# Patient Record
Sex: Female | Born: 1985
Health system: Southern US, Community
[De-identification: ages and names within clinical notes are randomized; demographics above are authoritative.]

## PROBLEM LIST (undated history)

## (undated) DIAGNOSIS — K219 Gastro-esophageal reflux disease without esophagitis: Secondary | ICD-10-CM

## (undated) DIAGNOSIS — Z8759 Personal history of other complications of pregnancy, childbirth and the puerperium: Secondary | ICD-10-CM

## (undated) DIAGNOSIS — O139 Gestational [pregnancy-induced] hypertension without significant proteinuria, unspecified trimester: Secondary | ICD-10-CM

## (undated) HISTORY — PX: WISDOM TOOTH EXTRACTION: SHX21

## (undated) HISTORY — PX: NO PAST SURGERIES: SHX2092

## (undated) HISTORY — DX: Gastro-esophageal reflux disease without esophagitis: K21.9

## (undated) HISTORY — DX: Gestational (pregnancy-induced) hypertension without significant proteinuria, unspecified trimester: O13.9

## (undated) HISTORY — DX: Personal history of other complications of pregnancy, childbirth and the puerperium: Z87.59

---

## 2013-01-23 ENCOUNTER — Ambulatory Visit: Payer: Self-pay | Admitting: Family Medicine

## 2013-01-28 ENCOUNTER — Ambulatory Visit: Payer: Self-pay | Admitting: Family Medicine

## 2013-03-03 ENCOUNTER — Ambulatory Visit: Payer: Self-pay | Admitting: Family Medicine

## 2014-10-01 ENCOUNTER — Ambulatory Visit (INDEPENDENT_AMBULATORY_CARE_PROVIDER_SITE_OTHER): Payer: BLUE CROSS/BLUE SHIELD | Admitting: Physician Assistant

## 2014-10-01 ENCOUNTER — Encounter: Payer: Self-pay | Admitting: Physician Assistant

## 2014-10-01 VITALS — BP 118/78 | Temp 98.4°F | Resp 16 | Ht 63.0 in | Wt 165.0 lb

## 2014-10-01 DIAGNOSIS — Z1239 Encounter for other screening for malignant neoplasm of breast: Secondary | ICD-10-CM

## 2014-10-01 DIAGNOSIS — Z3041 Encounter for surveillance of contraceptive pills: Secondary | ICD-10-CM

## 2014-10-01 DIAGNOSIS — Z0000 Encounter for general adult medical examination without abnormal findings: Secondary | ICD-10-CM | POA: Diagnosis not present

## 2014-10-01 DIAGNOSIS — K219 Gastro-esophageal reflux disease without esophagitis: Secondary | ICD-10-CM | POA: Insufficient documentation

## 2014-10-01 DIAGNOSIS — K449 Diaphragmatic hernia without obstruction or gangrene: Secondary | ICD-10-CM | POA: Insufficient documentation

## 2014-10-01 MED ORDER — NORETHIN ACE-ETH ESTRAD-FE 1-20 MG-MCG(24) PO CHEW: 1.0000 | CHEWABLE_TABLET | Freq: Every day | ORAL | Status: DC

## 2014-10-01 NOTE — Patient Instructions (Signed)

## 2014-10-01 NOTE — Progress Notes (Signed)
Patient ID: Christie [name], female   DOB: June 10, 1985, 29 y.o.   MRN: [id number]       Patient: Christie [name] Female    DOB: 1985/04/17   29 y.o.   MRN: [id number] Visit Date: 10/01/2014  Today's Provider: Margaretann Loveless, PA-C   Chief Complaint  Patient presents with  . Annual Exam  . Contraception    needs refill on birth control    Subjective:    HPI Patient is here for her annual exam. She feels well with no compaints. She is also needing her birth control refilled. She is requesting generic because it would be cheaper. Patient reports that her last pap was in 2014 in [place] and was normal.  There is no cervical or ovarian cancer in her family history. She does perform monthly breast exams. There is no family history of breast cancer. There is no family history of colon cancer.    No Known Allergies Previous Medications   MINASTRIN 24 FE 1-20 MG-MCG(24) CHEW    Chew 1 tablet by mouth daily.    Review of Systems  Constitutional: Negative.   HENT: Negative.   Eyes: Negative.   Respiratory: Negative.   Cardiovascular: Negative.   Gastrointestinal: Negative.   Endocrine: Negative.   Genitourinary: Negative.   Musculoskeletal: Negative.   Skin: Negative.   Allergic/Immunologic: Negative.   Neurological: Negative.   Hematological: Negative.   Psychiatric/Behavioral: Negative.     History  Substance Use Topics  . Smoking status: Never Smoker   . Smokeless tobacco: Not on file  . Alcohol Use: No   Objective:   BP 118/78 mmHg  Temp(Src) 98.4 F (36.9 C)  Resp 16  Ht 5\' 3"  (1.6 m)  Wt 165 lb (74.844 kg)  BMI 29.24 kg/m2  Physical Exam  Constitutional: She is oriented to person, place, and time. She appears well-developed and well-nourished. No distress.  HENT:  Head: Normocephalic and atraumatic.  Right Ear: External ear normal.  Left Ear: External ear normal.  Nose: Nose normal.  Mouth/Throat: Oropharynx is clear and moist. No oropharyngeal exudate.  Eyes:  Conjunctivae and EOM are normal. Pupils are equal, round, and reactive to light. Right eye exhibits no discharge. Left eye exhibits no discharge. No scleral icterus.  Neck: Normal range of motion. Neck supple. No JVD present. No tracheal deviation present. No thyromegaly present.  Cardiovascular: Normal rate, regular rhythm, normal heart sounds and intact distal pulses.  Exam reveals no gallop and no friction rub.   No murmur heard. Pulmonary/Chest: Effort normal and breath sounds normal. No respiratory distress. She has no wheezes. She has no rales. She exhibits no tenderness. Right breast exhibits no inverted nipple, no mass, no nipple discharge, no skin change and no tenderness. Left breast exhibits no inverted nipple, no mass, no nipple discharge, no skin change and no tenderness. Breasts are symmetrical.  Abdominal: Soft. Bowel sounds are normal. She exhibits no distension and no mass. There is no tenderness. There is no rebound and no guarding.  Genitourinary:  Deferred until next year.  Musculoskeletal: Normal range of motion. She exhibits no edema or tenderness.  Lymphadenopathy:    She has no cervical adenopathy.  Neurological: She is alert and oriented to person, place, and time.  Skin: Skin is warm and dry. No rash noted. She is not diaphoretic.  Psychiatric: She has a normal mood and affect. Her behavior is normal. Judgment and thought content normal.  Vitals reviewed.       Assessment &  Plan:     1. Annual physical exam We'll check labs, follow-up pending labs. - TSH - CBC w/Diff - Comprehensive Metabolic Panel (CMET) - Lipid panel  2. Breast cancer screening Breast exam is normal. She does perform monthly breast exams.  There is no family history of breast cancer.  3. Encounter for surveillance of contraceptive pills She has been on birth control for a while and has tolerated this very well. She needs a refill. Minastrin 24-Fe refilled as below. - Norethin Ace-Eth  Estrad-FE 1-20 MG-MCG(24) CHEW; Chew 1 tablet by mouth daily.  Dispense: 28 tablet; Refill: 11       Margaretann Loveless, PA-C  [facility]

## 2014-10-28 ENCOUNTER — Other Ambulatory Visit: Payer: Self-pay | Admitting: Physician Assistant

## 2014-10-28 DIAGNOSIS — Z3041 Encounter for surveillance of contraceptive pills: Secondary | ICD-10-CM

## 2014-10-28 MED ORDER — NORETHIN ACE-ETH ESTRAD-FE 1-20 MG-MCG(24) PO CHEW: 1.0000 | CHEWABLE_TABLET | Freq: Every day | ORAL | Status: DC

## 2014-10-28 NOTE — Telephone Encounter (Signed)
Patient advised. RX sent to pharmacy.  

## 2014-10-28 NOTE — Telephone Encounter (Signed)
Pt contacted office for refill request on the following medications:  Norethin Ace-Eth Estrad-FE 1-20 MG-MCG(24) CHEW.  Pt has changed pharmacy.  Pt is requesting this resent to [facility].  CB#[phone]/MW

## 2014-10-28 NOTE — Telephone Encounter (Signed)
Please notify patient new Rx sent to [facility].  Thanks!

## 2015-03-25 ENCOUNTER — Ambulatory Visit (INDEPENDENT_AMBULATORY_CARE_PROVIDER_SITE_OTHER): Payer: BLUE CROSS/BLUE SHIELD | Admitting: Physician Assistant

## 2015-03-25 ENCOUNTER — Encounter: Payer: Self-pay | Admitting: Physician Assistant

## 2015-03-25 VITALS — BP 120/80 | HR 87 | Temp 98.1°F | Resp 16 | Wt 158.2 lb

## 2015-03-25 DIAGNOSIS — Z3041 Encounter for surveillance of contraceptive pills: Secondary | ICD-10-CM | POA: Diagnosis not present

## 2015-03-25 MED ORDER — NORETHIN ACE-ETH ESTRAD-FE 1-20 MG-MCG PO TABS: 1.0000 | ORAL_TABLET | Freq: Every day | ORAL | Status: DC

## 2015-03-25 NOTE — Progress Notes (Signed)
       Patient: Christie [name] Female    DOB: 12/21/1985   30 y.o.   MRN: [id number] Visit Date: 03/25/2015  Today's Provider: Margaretann LovelessJennifer M Teng Decou, PA-C   Chief Complaint  Patient presents with  . Medication Refill   Subjective:    HPI Contraception Counseling: Patient presents for contraception counseling, patient is currently on the Junel and wants to change to a different one. Has been on this birth control for the past six month. The patient complains about dry eyes,headaches, cramping, today. The patient is sexually active. Pertinent past medical history: none.    No Known Allergies Previous Medications   No medications on file    Review of Systems  Constitutional: Negative.   Eyes: Negative.        Dry eyes  Respiratory: Negative.   Cardiovascular: Negative.   Gastrointestinal: Negative.   Endocrine: Negative.   Genitourinary: Negative.   Musculoskeletal: Negative.   Skin: Negative.   Allergic/Immunologic: Negative.   Neurological: Positive for headaches.  Hematological: Negative.   Psychiatric/Behavioral: Negative.     Social History  Substance Use Topics  . Smoking status: Never Smoker   . Smokeless tobacco: Not on file  . Alcohol Use: No   Objective:   BP 120/80 mmHg  Pulse 87  Temp(Src) 98.1 F (36.7 C) (Oral)  Resp 16  Wt 158 lb 3.2 oz (71.759 kg)  LMP 03/15/2015  Physical Exam  Constitutional: She appears well-developed and well-nourished. No distress.  HENT:  Head: Normocephalic and atraumatic.  Cardiovascular: Normal rate, regular rhythm and normal heart sounds.  Exam reveals no gallop and no friction rub.   No murmur heard. Pulmonary/Chest: Effort normal and breath sounds normal. No respiratory distress. She has no wheezes. She has no rales.  Skin: She is not diaphoretic.  Psychiatric: She has a normal mood and affect. Her behavior is normal. Judgment and thought content normal.  Vitals reviewed.       Assessment & Plan:     1.  Encounter for surveillance of contraceptive pills Having side effects with generic Junel so will change to Loestrin as below.  States she had moodiness with triphasic oral contraceptives so would like to stay on monophasic. She is to call the office if continues to have side effects.  Will also check labs from previous to make sure she does not have any other cause for these side effects.  - norethindrone-ethinyl estradiol (LOESTRIN FE 1/20) 1-20 MG-MCG tablet; Take 1 tablet by mouth daily.  Dispense: 1 Package; Refill: 3       Margaretann LovelessJennifer M Shlomo Seres, PA-C  [facility]

## 2015-03-25 NOTE — Patient Instructions (Signed)
Ethinyl Estradiol; Norethindrone Acetate tablets (contraception) What is this medicine? ETHINYL ESTRADIOL; NORETHINDRONE ACETATE (ETH in il es tra DYE ole; nor eth IN drone AS e tate) is an oral contraceptive. The products combine two types of female hormones, an estrogen and a progestin. They are used to prevent ovulation and pregnancy. This medicine may be used for other purposes; ask your health care provider or pharmacist if you have questions. What should I tell my health care provider before I take this medicine? They need to know if you have or ever had any of these conditions: -abnormal vaginal bleeding -blood vessel disease or blood clots -breast, cervical, endometrial, ovarian, liver, or uterine cancer -diabetes -gallbladder disease -heart disease or recent heart attack -high blood pressure -high cholesterol -kidney disease -liver disease -migraine headaches -stroke -systemic lupus erythematosus (SLE) -tobacco smoker -an unusual or allergic reaction to estrogens, progestins, other medicines, foods, dyes, or preservatives -pregnant or trying to get pregnant -breast-feeding How should I use this medicine? Take this medicine by mouth. To reduce nausea, this medicine may be taken with food. Follow the directions on the prescription label. Take this medicine at the same time each day and in the order directed on the package. Do not take your medicine more often than directed. Contact your pediatrician regarding the use of this medicine in children. Special care may be needed. This medicine has been used in female children who have started having menstrual periods. A patient package insert for the product will be given with each prescription and refill. Read this sheet carefully each time. The sheet may change frequently. Overdosage: If you think you have taken too much of this medicine contact a poison control center or emergency room at once. NOTE: This medicine is only for you. Do  not share this medicine with others. What if I miss a dose? If you miss a dose, refer to the patient information sheet you received with your medicine for direction. If you miss more than one pill, this medicine may not be as effective and you may need to use another form of birth control. What may interact with this medicine? -acetaminophen -antibiotics or medicines for infections, especially rifampin, rifabutin, rifapentine, and griseofulvin, and possibly penicillins or tetracyclines -aprepitant -ascorbic acid (vitamin C) -atorvastatin -barbiturate medicines, such as phenobarbital -bosentan -carbamazepine -caffeine -clofibrate -cyclosporine -dantrolene -doxercalciferol -felbamate -grapefruit juice -hydrocortisone -medicines for anxiety or sleeping problems, such as diazepam or temazepam -medicines for diabetes, including pioglitazone -mineral oil -modafinil -mycophenolate -nefazodone -oxcarbazepine -phenytoin -prednisolone -ritonavir or other medicines for HIV infection or AIDS -rosuvastatin -selegiline -soy isoflavones supplements -St. John's wort -tamoxifen or raloxifene -theophylline -thyroid hormones -topiramate -warfarin This list may not describe all possible interactions. Give your health care provider a list of all the medicines, herbs, non-prescription drugs, or dietary supplements you use. Also tell them if you smoke, drink alcohol, or use illegal drugs. Some items may interact with your medicine. What should I watch for while using this medicine? Visit your doctor or health care professional for regular checks on your progress. You will need a regular breast and pelvic exam and Pap smear while on this medicine. Use an additional method of contraception during the first cycle that you take these tablets. If you have any reason to think you are pregnant, stop taking this medicine right away and contact your doctor or health care professional. If you are taking  this medicine for hormone related problems, it may take several cycles of use to see improvement in your   condition. Smoking increases the risk of getting a blood clot or having a stroke while you are taking birth control pills, especially if you are more than 28 years old. You are strongly advised not to smoke. This medicine can make your body retain fluid, making your fingers, hands, or ankles swell. Your blood pressure can go up. Contact your doctor or health care professional if you feel you are retaining fluid. This medicine can make you more sensitive to the sun. Keep out of the sun. If you cannot avoid being in the sun, wear protective clothing and use sunscreen. Do not use sun lamps or tanning beds/booths. If you wear contact lenses and notice visual changes, or if the lenses begin to feel uncomfortable, consult your eye care specialist. In some women, tenderness, swelling, or minor bleeding of the gums may occur. Notify your dentist if this happens. Brushing and flossing your teeth regularly may help limit this. See your dentist regularly and inform your dentist of the medicines you are taking. If you are going to have elective surgery, you may need to stop taking this medicine before the surgery. Consult your health care professional for advice. This medicine does not protect you against HIV infection (AIDS) or any other sexually transmitted diseases. What side effects may I notice from receiving this medicine? Side effects that you should report to your doctor or health care professional as soon as possible: -breast tissue changes or discharge -changes in vaginal bleeding during your period or between your periods -chest pain -coughing up blood -dizziness or fainting spells -headaches or migraines -leg, arm or groin pain -severe or sudden headaches -stomach pain (severe) -sudden shortness of breath -sudden loss of coordination, especially on one side of the body -speech  problems -symptoms of vaginal infection like itching, irritation or unusual discharge -tenderness in the upper abdomen -vomiting -weakness or numbness in the arms or legs, especially on one side of the body -yellowing of the eyes or skin Side effects that usually do not require medical attention (report to your doctor or health care professional if they continue or are bothersome): -breakthrough bleeding and spotting that continues beyond the 3 initial cycles of pills -breast tenderness -mood changes, anxiety, depression, frustration, anger, or emotional outbursts -increased sensitivity to sun or ultraviolet light -nausea -skin rash, acne, or brown spots on the skin -weight gain (slight) This list may not describe all possible side effects. Call your doctor for medical advice about side effects. You may report side effects to FDA at 1-800-FDA-1088. Where should I keep my medicine? Keep out of the reach of children. Store at room temperature between 15 and 30 degrees C (59 and 86 degrees F). Throw away any unused medicine after the expiration date. NOTE: This sheet is a summary. It may not cover all possible information. If you have questions about this medicine, talk to your doctor, pharmacist, or health care provider.    2016, Elsevier/Gold Standard. (2012-07-04 15:35:20)  

## 2015-04-18 ENCOUNTER — Telehealth: Payer: Self-pay | Admitting: Physician Assistant

## 2015-04-18 DIAGNOSIS — Z3009 Encounter for other general counseling and advice on contraception: Secondary | ICD-10-CM

## 2015-04-18 MED ORDER — LEVONORGESTREL-ETHINYL ESTRAD 0.1-20 MG-MCG PO TABS: 1.0000 | ORAL_TABLET | Freq: Every day | ORAL | Status: DC

## 2015-04-18 NOTE — Telephone Encounter (Signed)
Advised patient as below.  

## 2015-04-18 NOTE — Telephone Encounter (Signed)
Please advise.  Thanks,  -Coretta Leisey 

## 2015-04-18 NOTE — Telephone Encounter (Signed)
Not sure what happened. Sent in [facility] to [facility].

## 2015-04-18 NOTE — Telephone Encounter (Signed)
Pt stated that when she was in for an OV on 03/25/15 her birth control was changed but when she went to the pharmacy to get the new RX she stated that it was the same as what she had. norethindrone-ethinyl estradiol (LOESTRIN FE 1/20) 1-20 MG-MCG tablet was sent to [facility] on 03/25/15. Please advise. Thanks TNP

## 2016-03-12 NOTE — L&D Delivery Note (Signed)
Delivery Note At 7:13 PM a viable female was delivered via Vaginal, Spontaneous (Presentation:OA ;  ).  APGAR: 8, 9; weight  .   Placenta statusspont>>intact: , .  Cord:  with the following complications: .  Cord pH: not sent  Anesthesia:  epid Episiotomy: None Lacerations: Periurethral;2nd degree;Perineal Suture Repair: 3.0 vicryl rapide Est. Blood Loss (mL): 300  Mom to postpartum.  Baby to Couplet care / Skin to Skin.  [name],Anzley Dibbern M 01/18/2017, 7:47 PM

## 2016-05-28 ENCOUNTER — Telehealth: Payer: Self-pay

## 2016-05-28 NOTE — Telephone Encounter (Signed)
Acyclovir/valacyclovir is not thought to increase the risk for birth defects. The manufacturer, in combination with the Centers for Disease Control (CDC), looked at the effects of acyclovir on the developing baby. No increase in birth defects was seen in over 500 births. Also, a separate study found no increase in birth defects in over 1,500 infants exposed to acyclovir and over 200 infants exposed to valacyclovir during the first trimester.  Also congratulations on the pregnancy!

## 2016-05-28 NOTE — Telephone Encounter (Signed)
Patient advised as below. Patient verbalizes understanding and is in agreement with treatment plan.  

## 2016-05-28 NOTE — Telephone Encounter (Signed)
Patient calling that she has a sore on her lip and [facility]. She states she is [redacted] weeks pregnant. She wants to know if it is safe for to the generic name of Valtrex while pregnant. (If she has a flare)?   Please advise.  Thanks,  -Rilla Buckman

## 2016-07-04 DIAGNOSIS — R768 Other specified abnormal immunological findings in serum: Secondary | ICD-10-CM | POA: Insufficient documentation

## 2016-07-05 LAB — OB RESULTS CONSOLE GC/CHLAMYDIA
CHLAMYDIA, DNA PROBE: NEGATIVE
GC PROBE AMP, GENITAL: NEGATIVE

## 2016-07-05 LAB — OB RESULTS CONSOLE RPR: RPR: NONREACTIVE

## 2016-07-05 LAB — OB RESULTS CONSOLE ABO/RH: RH Type: POSITIVE

## 2016-07-05 LAB — OB RESULTS CONSOLE HEPATITIS B SURFACE ANTIGEN: Hepatitis B Surface Ag: NEGATIVE

## 2016-07-05 LAB — OB RESULTS CONSOLE ANTIBODY SCREEN: ANTIBODY SCREEN: NEGATIVE

## 2016-07-05 LAB — OB RESULTS CONSOLE RUBELLA ANTIBODY, IGM: Rubella: IMMUNE

## 2016-07-05 LAB — OB RESULTS CONSOLE HIV ANTIBODY (ROUTINE TESTING): HIV: NONREACTIVE

## 2016-12-26 LAB — OB RESULTS CONSOLE GBS: GBS: NEGATIVE

## 2017-01-16 ENCOUNTER — Encounter: Payer: Self-pay | Admitting: Physician Assistant

## 2017-01-17 ENCOUNTER — Encounter (HOSPITAL_COMMUNITY): Payer: Self-pay | Admitting: *Deleted

## 2017-01-17 ENCOUNTER — Inpatient Hospital Stay (HOSPITAL_COMMUNITY)
Admission: AD | Admit: 2017-01-17 | Discharge: 2017-01-20 | DRG: 807 | Disposition: A | Discharge status: Home / Self Care | Payer: BLUE CROSS/BLUE SHIELD | Source: Ambulatory Visit | Attending: Obstetrics and Gynecology | Admitting: Obstetrics and Gynecology

## 2017-01-17 ENCOUNTER — Telehealth (HOSPITAL_COMMUNITY): Payer: Self-pay | Admitting: *Deleted

## 2017-01-17 ENCOUNTER — Encounter (HOSPITAL_COMMUNITY): Payer: Self-pay | Admitting: Anesthesiology

## 2017-01-17 DIAGNOSIS — O9962 Diseases of the digestive system complicating childbirth: Secondary | ICD-10-CM | POA: Diagnosis present

## 2017-01-17 DIAGNOSIS — O134 Gestational [pregnancy-induced] hypertension without significant proteinuria, complicating childbirth: Secondary | ICD-10-CM | POA: Diagnosis present

## 2017-01-17 DIAGNOSIS — Z3490 Encounter for supervision of normal pregnancy, unspecified, unspecified trimester: Secondary | ICD-10-CM

## 2017-01-17 DIAGNOSIS — K219 Gastro-esophageal reflux disease without esophagitis: Secondary | ICD-10-CM | POA: Diagnosis present

## 2017-01-17 DIAGNOSIS — R030 Elevated blood-pressure reading, without diagnosis of hypertension: Secondary | ICD-10-CM | POA: Diagnosis present

## 2017-01-17 DIAGNOSIS — Z3A38 38 weeks gestation of pregnancy: Secondary | ICD-10-CM

## 2017-01-17 DIAGNOSIS — O1413 Severe pre-eclampsia, third trimester: Secondary | ICD-10-CM

## 2017-01-17 HISTORY — DX: Encounter for supervision of normal pregnancy, unspecified, unspecified trimester: Z34.90

## 2017-01-17 LAB — CBC
HEMATOCRIT: 39.8 % (ref 36.0–46.0)
Hemoglobin: 13.5 g/dL (ref 12.0–15.0)
MCH: 30 pg (ref 26.0–34.0)
MCHC: 33.9 g/dL (ref 30.0–36.0)
MCV: 88.4 fL (ref 78.0–100.0)
Platelets: 132 10*3/uL — ABNORMAL LOW (ref 150–400)
RBC: 4.5 MIL/uL (ref 3.87–5.11)
RDW: 14.2 % (ref 11.5–15.5)
WBC: 11.2 10*3/uL — ABNORMAL HIGH (ref 4.0–10.5)

## 2017-01-17 LAB — URINALYSIS, ROUTINE W REFLEX MICROSCOPIC
BILIRUBIN URINE: NEGATIVE
Glucose, UA: NEGATIVE mg/dL
KETONES UR: NEGATIVE mg/dL
Leukocytes, UA: NEGATIVE
Nitrite: NEGATIVE
PROTEIN: NEGATIVE mg/dL
Specific Gravity, Urine: <1.005 — ABNORMAL LOW (ref 1.005–1.030)
pH: 5.5 (ref 5.0–8.0)

## 2017-01-17 LAB — COMPREHENSIVE METABOLIC PANEL
ALK PHOS: 164 U/L — AB (ref 38–126)
ALT: 17 U/L (ref 14–54)
ANION GAP: 11 (ref 5–15)
AST: 25 U/L (ref 15–41)
Albumin: 3.3 g/dL — ABNORMAL LOW (ref 3.5–5.0)
BILIRUBIN TOTAL: 0.1 mg/dL — AB (ref 0.3–1.2)
BUN: 11 mg/dL (ref 6–20)
CALCIUM: 8.9 mg/dL (ref 8.9–10.3)
CO2: 20 mmol/L — ABNORMAL LOW (ref 22–32)
Chloride: 105 mmol/L (ref 101–111)
Creatinine, Ser: 0.67 mg/dL (ref 0.44–1.00)
GFR calc Af Amer: >60 mL/min (ref >60)
Glucose, Bld: 65 mg/dL (ref 65–99)
POTASSIUM: 3.9 mmol/L (ref 3.5–5.1)
Sodium: 136 mmol/L (ref 135–145)
TOTAL PROTEIN: 7 g/dL (ref 6.5–8.1)

## 2017-01-17 LAB — PROTEIN / CREATININE RATIO, URINE
Creatinine, Urine: 14 mg/dL
Protein Creatinine Ratio: 0.57 mg/mg{Cre} — ABNORMAL HIGH (ref 0.00–0.15)
Total Protein, Urine: 8 mg/dL

## 2017-01-17 LAB — TYPE AND SCREEN
ABO/RH(D): O POS
Antibody Screen: NEGATIVE

## 2017-01-17 LAB — ABO/RH: ABO/RH(D): O POS

## 2017-01-17 LAB — URINALYSIS, MICROSCOPIC (REFLEX)

## 2017-01-17 MED ORDER — OXYCODONE-ACETAMINOPHEN 5-325 MG PO TABS: 1.0000 | ORAL_TABLET | ORAL | Status: DC | PRN

## 2017-01-17 MED ORDER — OXYTOCIN 40 UNITS IN LACTATED RINGERS INFUSION - SIMPLE MED: 2.5000 [IU]/h | INTRAVENOUS | Status: DC

## 2017-01-17 MED ORDER — LABETALOL HCL 5 MG/ML IV SOLN
20.0000 mg | INTRAVENOUS | Status: AC | PRN
  Administered 2017-01-17: 20 mg via INTRAVENOUS
  Administered 2017-01-17: 40 mg via INTRAVENOUS
  Administered 2017-01-18: 60 mg via INTRAVENOUS
  Filled 2017-01-17: qty 8
  Filled 2017-01-17: qty 4
  Filled 2017-01-17: qty 12

## 2017-01-17 MED ORDER — ZOLPIDEM TARTRATE 5 MG PO TABS
5.0000 mg | ORAL_TABLET | Freq: Every evening | ORAL | Status: DC | PRN
  Administered 2017-01-18: 5 mg via ORAL
  Filled 2017-01-17: qty 1

## 2017-01-17 MED ORDER — LACTATED RINGERS IV SOLN
500.0000 mL | INTRAVENOUS | Status: DC | PRN
  Administered 2017-01-18: 700 mL via INTRAVENOUS

## 2017-01-17 MED ORDER — HYDRALAZINE HCL 20 MG/ML IJ SOLN
10.0000 mg | Freq: Once | INTRAMUSCULAR | Status: AC | PRN
  Administered 2017-01-18: 10 mg via INTRAVENOUS
  Filled 2017-01-17: qty 1

## 2017-01-17 MED ORDER — MAGNESIUM SULFATE 40 G IN LACTATED RINGERS - SIMPLE
2.0000 g/h | INTRAVENOUS | Status: DC
  Administered 2017-01-17 – 2017-01-18 (×2): 2 g/h via INTRAVENOUS
  Filled 2017-01-17 (×2): qty 40

## 2017-01-17 MED ORDER — LACTATED RINGERS IV SOLN
INTRAVENOUS | Status: DC
  Administered 2017-01-17: 125 mL/h via INTRAVENOUS
  Administered 2017-01-18: 05:00:00 via INTRAVENOUS

## 2017-01-17 MED ORDER — ACETAMINOPHEN 325 MG PO TABS
650.0000 mg | ORAL_TABLET | ORAL | Status: DC | PRN
  Administered 2017-01-17 – 2017-01-18 (×3): 650 mg via ORAL
  Filled 2017-01-17 (×4): qty 2

## 2017-01-17 MED ORDER — MISOPROSTOL 25 MCG QUARTER TABLET
25.0000 ug | ORAL_TABLET | ORAL | Status: AC
  Administered 2017-01-17 – 2017-01-18 (×2): 25 ug via VAGINAL
  Filled 2017-01-17 (×2): qty 1

## 2017-01-17 MED ORDER — SOD CITRATE-CITRIC ACID 500-334 MG/5ML PO SOLN: 30.0000 mL | ORAL | Status: DC | PRN

## 2017-01-17 MED ORDER — OXYCODONE-ACETAMINOPHEN 5-325 MG PO TABS: 2.0000 | ORAL_TABLET | ORAL | Status: DC | PRN

## 2017-01-17 MED ORDER — MAGNESIUM SULFATE BOLUS VIA INFUSION
4.0000 g | Freq: Once | INTRAVENOUS | Status: AC
  Administered 2017-01-17: 4 g via INTRAVENOUS
  Filled 2017-01-17: qty 500

## 2017-01-17 MED ORDER — LIDOCAINE HCL (PF) 1 % IJ SOLN
30.0000 mL | INTRAMUSCULAR | Status: DC | PRN
  Filled 2017-01-17: qty 30

## 2017-01-17 MED ORDER — ONDANSETRON HCL 4 MG/2ML IJ SOLN
4.0000 mg | Freq: Four times a day (QID) | INTRAMUSCULAR | Status: DC | PRN
  Administered 2017-01-18: 4 mg via INTRAVENOUS
  Filled 2017-01-17: qty 2

## 2017-01-17 MED ORDER — OXYTOCIN BOLUS FROM INFUSION
500.0000 mL | Freq: Once | INTRAVENOUS | Status: AC
  Administered 2017-01-18: 500 mL via INTRAVENOUS

## 2017-01-17 MED ORDER — TERBUTALINE SULFATE 1 MG/ML IJ SOLN: 0.2500 mg | Freq: Once | INTRAMUSCULAR | Status: DC | PRN

## 2017-01-17 MED ORDER — FLEET ENEMA 7-19 GM/118ML RE ENEM: 1.0000 | ENEMA | RECTAL | Status: DC | PRN

## 2017-01-17 NOTE — H&P (Signed)
Christie [name] is a 31 y.o. G 1 P 0 at 3838 w 4 days presented from triage with elevated BPs requiring IV labetalol. She is now admitted and status post first dose of Cytotec and now on Magnesium infusion OB History    Gravida Para Term Preterm AB Living   1         0   SAB TAB Ectopic Multiple Live Births                 Past Medical History:  Diagnosis Date  . GERD (gastroesophageal reflux disease)    Past Surgical History:  Procedure Laterality Date  . NO PAST SURGERIES    . WISDOM TOOTH EXTRACTION     Family History: family history includes Hypertension in her mother; Melanoma in her mother; Thyroid disease in her mother. Social History:  reports that  has never smoked. she has never used smokeless tobacco. She reports that she does not drink alcohol or use drugs.     Maternal Diabetes: No Genetic Screening: Normal Maternal Ultrasounds/Referrals: Normal Fetal Ultrasounds or other Referrals:  None Maternal Substance Abuse:  No Significant Maternal Medications:  None Significant Maternal Lab Results:  None Other Comments:  None  Review of Systems  All other systems reviewed and are negative.  History Dilation: Closed Exam by:: L.Stubbs, RN Blood pressure (!) 172/109, pulse 86, temperature 98.5 F (36.9 C), temperature source Oral, resp. rate 18, height 5\' 4"  (1.626 m), weight 88.5 kg (195 lb), last menstrual period 04/22/2016. Maternal Exam:  Abdomen: Fetal presentation: vertex     Fetal Exam Fetal State Assessment: Category I - tracings are normal.     Physical Exam  Nursing note and vitals reviewed. Constitutional: She appears well-developed.  HENT:  Head: Normocephalic.  Eyes: Pupils are equal, round, and reactive to light.  Neck: Normal range of motion.  Cardiovascular: Normal rate and regular rhythm.  Respiratory: Effort normal.  GI: Soft.    Prenatal labs: ABO, Rh: --/--/O POS (11/08 1930) Antibody: NEG (11/08 1930) Rubella: Immune (04/26  0000) RPR: Nonreactive (04/26 0000)  HBsAg: Negative (04/26 0000)  HIV: Non-reactive (04/26 0000)  GBS:    Results for orders placed or performed during the hospital encounter of 01/17/17 (from the past 24 hour(s))  Protein / creatinine ratio, urine     Status: Abnormal   Collection Time: 01/17/17  5:52 PM  Result Value Ref Range   Creatinine, Urine 14.00 mg/dL   Total Protein, Urine 8 mg/dL   Protein Creatinine Ratio 0.57 (H) 0.00 - 0.15 mg/mg[Cre]  Urinalysis, Routine w reflex microscopic     Status: Abnormal   Collection Time: 01/17/17  5:52 PM  Result Value Ref Range   Color, Urine YELLOW YELLOW   APPearance CLEAR CLEAR   Specific Gravity, Urine <1.005 (L) 1.005 - 1.030   pH 5.5 5.0 - 8.0   Glucose, UA NEGATIVE NEGATIVE mg/dL   Hgb urine dipstick TRACE (A) NEGATIVE   Bilirubin Urine NEGATIVE NEGATIVE   Ketones, ur NEGATIVE NEGATIVE mg/dL   Protein, ur NEGATIVE NEGATIVE mg/dL   Nitrite NEGATIVE NEGATIVE   Leukocytes, UA NEGATIVE NEGATIVE  Urinalysis, Microscopic (reflex)     Status: Abnormal   Collection Time: 01/17/17  5:52 PM  Result Value Ref Range   RBC / HPF 0-5 0 - 5 RBC/hpf   WBC, UA 0-5 0 - 5 WBC/hpf   Bacteria, UA RARE (A) NONE SEEN   Squamous Epithelial / LPF 0-5 (A) NONE SEEN  CBC  Status: Abnormal   Collection Time: 01/17/17  7:30 PM  Result Value Ref Range   WBC 11.2 (H) 4.0 - 10.5 K/uL   RBC 4.50 3.87 - 5.11 MIL/uL   Hemoglobin 13.5 12.0 - 15.0 g/dL   HCT 41.339.8 24.436.0 - 01.046.0 %   MCV 88.4 78.0 - 100.0 fL   MCH 30.0 26.0 - 34.0 pg   MCHC 33.9 30.0 - 36.0 g/dL   RDW 27.214.2 53.611.5 - 64.415.5 %   Platelets 132 (L) 150 - 400 K/uL  Comprehensive metabolic panel     Status: Abnormal   Collection Time: 01/17/17  7:30 PM  Result Value Ref Range   Sodium 136 135 - 145 mmol/L   Potassium 3.9 3.5 - 5.1 mmol/L   Chloride 105 101 - 111 mmol/L   CO2 20 (L) 22 - 32 mmol/L   Glucose, Bld 65 65 - 99 mg/dL   BUN 11 6 - 20 mg/dL   Creatinine, Ser 0.340.67 0.44 - 1.00 mg/dL    Calcium 8.9 8.9 - 74.210.3 mg/dL   Total Protein 7.0 6.5 - 8.1 g/dL   Albumin 3.3 (L) 3.5 - 5.0 g/dL   AST 25 15 - 41 U/L   ALT 17 14 - 54 U/L   Alkaline Phosphatase 164 (H) 38 - 126 U/L   Total Bilirubin 0.1 (L) 0.3 - 1.2 mg/dL   GFR calc non Af Amer >60 >60 mL/min   GFR calc Af Amer >60 >60 mL/min   Anion gap 11 5 - 15  Type and screen [facility]     Status: None   Collection Time: 01/17/17  7:30 PM  Result Value Ref Range   ABO/RH(D) O POS    Antibody Screen NEG    Sample Expiration 01/20/2017     Assessment/Plan: IUP at 38 w 4 days Gestational hypertension Continue Magnesium  Labetalol prn Continue cytotec for 3 total doses tonight  Plan of care reviewed with patient   Wess Baney L 01/17/2017, 9:10 PM

## 2017-01-17 NOTE — MAU Provider Note (Signed)
Chief Complaint:  Hypertension and Headache   Provider entered room at 1835   HPI: Christie [name] is a 31 y.o. G1P0 at 2638w4dwho presents to maternity admissions reporting elevated BP in office yesterday and development of headache today, unresponsive to Tylenol.  Developed swelling about 2 weeks ago, but hypertension is new since yesterday. She reports good fetal movement, denies LOF, vaginal bleeding, vaginal itching/burning, urinary symptoms, dizziness, n/v, diarrhea, constipation or fever/chills.  She denies visual changes.  Does have some RUQ abdominal pain.  Hypertension  This is a new problem. The current episode started yesterday. The problem has been gradually worsening since onset. Associated symptoms include headaches and peripheral edema. Pertinent negatives include no anxiety, blurred vision, chest pain, palpitations or shortness of breath. There are no associated agents to hypertension. There are no known risk factors for coronary artery disease. Past treatments include nothing. There are no compliance problems.   Headache   This is a new problem. The current episode started today. The problem occurs constantly. The problem has been unchanged. Pertinent negatives include no back pain, blurred vision, fever, photophobia, visual change, vomiting or weakness. Nothing aggravates the symptoms. She has tried acetaminophen for the symptoms. The treatment provided no relief. Her past medical history is significant for hypertension.      Past Medical History: Past Medical History:  Diagnosis Date  . GERD (gastroesophageal reflux disease)     Past obstetric history: OB History  Gravida Para Term Preterm AB Living  1         0  SAB TAB Ectopic Multiple Live Births               # Outcome Date GA Lbr Len/2nd Weight Sex Delivery Anes PTL Lv  1 Current               Past Surgical History: Past Surgical History:  Procedure Laterality Date  . NO PAST SURGERIES    . WISDOM TOOTH  EXTRACTION      Family History: Family History  Problem Relation Age of Onset  . Melanoma Mother   . Thyroid disease Mother   . Hypertension Mother     Social History: Social History   Tobacco Use  . Smoking status: Never Smoker  . Smokeless tobacco: Never Used  Substance Use Topics  . Alcohol use: No    Alcohol/week: 0.0 oz  . Drug use: No    Allergies: No Known Allergies  Meds:  Medications Prior to Admission  Medication Sig Dispense Refill Last Dose  . levonorgestrel-ethinyl estradiol (AVIANE,ALESSE,LESSINA) 0.1-20 MG-MCG tablet Take 1 tablet by mouth daily. 1 Package 11     I have reviewed patient's Past Medical Hx, Surgical Hx, Family Hx, Social Hx, medications and allergies.   ROS:  Review of Systems  Constitutional: Negative for fever.  Eyes: Negative for blurred vision and photophobia.  Respiratory: Negative for shortness of breath.   Cardiovascular: Negative for chest pain and palpitations.  Gastrointestinal: Negative for vomiting.  Musculoskeletal: Negative for back pain.  Neurological: Positive for headaches. Negative for weakness.   Other systems negative  Physical Exam   Patient Vitals for the past 24 hrs:  BP Temp Pulse Resp Height  01/17/17 1845 (!) 152/107 - 90 - -  01/17/17 1830 (!) 147/97 - 80 20 -  01/17/17 1819 (!) 152/98 - 78 - -  01/17/17 1800 (!) 169/114 97.8 F (36.6 C) 89 18 5\' 4"  (1.626 m)   Vitals:   01/17/17 1800 01/17/17 1819  01/17/17 1830 01/17/17 1845  BP: (!) 169/114 (!) 152/98 (!) 147/97 (!) 152/107  Pulse: 89 78 80 90  Resp: 18  20   Temp: 97.8 F (36.6 C)     Height: 5\' 4"  (1.626 m)       Constitutional: Well-developed, well-nourished female in no acute distress.  Cardiovascular: normal rate and rhythm Respiratory: normal effort, clear to auscultation bilaterally GI: Abd soft, non-tender, gravid appropriate for gestational age.   No rebound or guarding. MS: Extremities nontender, no edema, normal ROM Neurologic:  Alert and oriented x 4.  GU: Neg CVAT.  PELVIC EXAM: Deferred , states cervix was long/closed  FHT:  Baseline 140 , moderate variability, accelerations present, no decelerations Contractions: Irregular, mild   Labs: Results for orders placed or performed during the hospital encounter of 01/17/17 (from the past 24 hour(s))  Protein / creatinine ratio, urine     Status: Abnormal   Collection Time: 01/17/17  5:52 PM  Result Value Ref Range   Creatinine, Urine 14.00 mg/dL   Total Protein, Urine 8 mg/dL   Protein Creatinine Ratio 0.57 (H) 0.00 - 0.15 mg/mg[Cre]  Urinalysis, Routine w reflex microscopic     Status: Abnormal   Collection Time: 01/17/17  5:52 PM  Result Value Ref Range   Color, Urine YELLOW YELLOW   APPearance CLEAR CLEAR   Specific Gravity, Urine <1.005 (L) 1.005 - 1.030   pH 5.5 5.0 - 8.0   Glucose, UA NEGATIVE NEGATIVE mg/dL   Hgb urine dipstick TRACE (A) NEGATIVE   Bilirubin Urine NEGATIVE NEGATIVE   Ketones, ur NEGATIVE NEGATIVE mg/dL   Protein, ur NEGATIVE NEGATIVE mg/dL   Nitrite NEGATIVE NEGATIVE   Leukocytes, UA NEGATIVE NEGATIVE  Urinalysis, Microscopic (reflex)     Status: Abnormal   Collection Time: 01/17/17  5:52 PM  Result Value Ref Range   RBC / HPF 0-5 0 - 5 RBC/hpf   WBC, UA 0-5 0 - 5 WBC/hpf   Bacteria, UA RARE (A) NONE SEEN   Squamous Epithelial / LPF 0-5 (A) NONE SEEN   O/Positive/-- (04/26 0000)  Imaging:  No results found.  MAU Course/MDM: I have ordered labs and reviewed results. Preeclampsia labs pending NST reviewed and found to be reactive.  Irregular mild contractions Consult Dr Vincente PoliGrewal with presentation, exam findings and test results.  Treatments in MAU included EFM.    Assessment: SIngle IUP at 2873w4d Preeclampsia with severe features  Plan[facility] Routine orders Magnesium sulfate infusion Preeclampsia focused order set Induction of labor MD to follow  Wynelle BourgeoisMarie Epifania Littrell CNM, MSN Certified  Nurse-Midwife 01/17/2017 6:53 PM

## 2017-01-17 NOTE — MAU Note (Signed)
Pt presents with c/o of H/A  And epigastric pain.  Pt reports had elevated BP in office yesterday, instructed to be seen if other symptoms of Pre Eclampsia noted.  Denies visual disturbances, DTR's 2+, mild swelling noted in bilat feet.

## 2017-01-17 NOTE — Anesthesia Pain Management Evaluation Note (Signed)
  CRNA Pain Management Visit Note  Patient: Christie [name], 31 y.o., female  "Hello I am a member of the anesthesia team at [facility]. We have an anesthesia team available at all times to provide care throughout the hospital, including epidural management and anesthesia for C-section. I don't know your plan for the delivery whether it a natural birth, water birth, IV sedation, nitrous supplementation, doula or epidural, but we want to meet your pain goals."   1.Was your pain managed to your expectations on prior hospitalizations?   No prior hospitalizations  2.What is your expectation for pain management during this hospitalization?     Epidural, IV pain meds and Nitrous Oxide  3.How can we help you reach that goal? Be available  Record the patient's initial score and the patient's pain goal.   Pain: 0  Pain Goal: 5 [facility] wants you to be able to say your pain was always managed very well.  [facility] Medical CenterMERRITT,Bobbi Kozakiewicz 01/17/2017

## 2017-01-17 NOTE — Telephone Encounter (Signed)
Preadmission screen  

## 2017-01-17 NOTE — MAU Note (Signed)
Pt had elkevated B/P in office yesterday. Told to come in if she started having a H/A or abd pain. Pt c/o H/A and sharp pain to her RUQ.

## 2017-01-18 ENCOUNTER — Inpatient Hospital Stay (HOSPITAL_COMMUNITY): Payer: BLUE CROSS/BLUE SHIELD | Admitting: Anesthesiology

## 2017-01-18 ENCOUNTER — Other Ambulatory Visit: Payer: Self-pay

## 2017-01-18 ENCOUNTER — Encounter (HOSPITAL_COMMUNITY): Payer: Self-pay | Admitting: Anesthesiology

## 2017-01-18 LAB — CBC
HCT: 39.5 % (ref 36.0–46.0)
HCT: 37.7 % (ref 36.0–46.0)
Hemoglobin: 13.5 g/dL (ref 12.0–15.0)
Hemoglobin: 13 g/dL (ref 12.0–15.0)
MCH: 30.1 pg (ref 26.0–34.0)
MCH: 30.1 pg (ref 26.0–34.0)
MCHC: 34.2 g/dL (ref 30.0–36.0)
MCHC: 34.5 g/dL (ref 30.0–36.0)
MCV: 88 fL (ref 78.0–100.0)
MCV: 87.3 fL (ref 78.0–100.0)
PLATELETS: 124 10*3/uL — AB (ref 150–400)
Platelets: 120 10*3/uL — ABNORMAL LOW (ref 150–400)
RBC: 4.49 MIL/uL (ref 3.87–5.11)
RBC: 4.32 MIL/uL (ref 3.87–5.11)
RDW: 14.2 % (ref 11.5–15.5)
RDW: 14.5 % (ref 11.5–15.5)
WBC: 11.9 10*3/uL — ABNORMAL HIGH (ref 4.0–10.5)
WBC: 17 10*3/uL — ABNORMAL HIGH (ref 4.0–10.5)

## 2017-01-18 LAB — RPR: RPR Ser Ql: NONREACTIVE

## 2017-01-18 MED ORDER — EPHEDRINE 5 MG/ML INJ
10.0000 mg | INTRAVENOUS | Status: DC | PRN
  Filled 2017-01-18: qty 2

## 2017-01-18 MED ORDER — LIDOCAINE HCL (PF) 1 % IJ SOLN
INTRAMUSCULAR | Status: DC | PRN
  Administered 2017-01-18 (×3): 4 mL via EPIDURAL
  Administered 2017-01-18: 4 mL

## 2017-01-18 MED ORDER — DIPHENHYDRAMINE HCL 50 MG/ML IJ SOLN: 12.5000 mg | INTRAMUSCULAR | Status: DC | PRN

## 2017-01-18 MED ORDER — OXYTOCIN 40 UNITS IN LACTATED RINGERS INFUSION - SIMPLE MED
1.0000 m[IU]/min | INTRAVENOUS | Status: DC
  Administered 2017-01-18: 2 m[IU]/min via INTRAVENOUS
  Filled 2017-01-18: qty 1000

## 2017-01-18 MED ORDER — PRENATAL MULTIVITAMIN CH
1.0000 | ORAL_TABLET | Freq: Every day | ORAL | Status: DC
  Administered 2017-01-19 – 2017-01-20 (×2): 1 via ORAL
  Filled 2017-01-18 (×2): qty 1

## 2017-01-18 MED ORDER — BENZOCAINE-MENTHOL 20-0.5 % EX AERO
1.0000 "application " | INHALATION_SPRAY | CUTANEOUS | Status: DC | PRN
  Administered 2017-01-19: 1 via TOPICAL
  Filled 2017-01-18: qty 56

## 2017-01-18 MED ORDER — DIBUCAINE 1 % RE OINT: 1.0000 "application " | TOPICAL_OINTMENT | RECTAL | Status: DC | PRN

## 2017-01-18 MED ORDER — FLEET ENEMA 7-19 GM/118ML RE ENEM: 1.0000 | ENEMA | Freq: Every day | RECTAL | Status: DC | PRN

## 2017-01-18 MED ORDER — ACETAMINOPHEN 325 MG PO TABS
650.0000 mg | ORAL_TABLET | ORAL | Status: DC | PRN
  Administered 2017-01-19 – 2017-01-20 (×4): 650 mg via ORAL
  Filled 2017-01-18 (×3): qty 2

## 2017-01-18 MED ORDER — SIMETHICONE 80 MG PO CHEW
80.0000 mg | CHEWABLE_TABLET | ORAL | Status: DC | PRN
Start: 2017-01-18 — End: 2017-01-20

## 2017-01-18 MED ORDER — TETANUS-DIPHTH-ACELL PERTUSSIS 5-2.5-18.5 LF-MCG/0.5 IM SUSP: 0.5000 mL | Freq: Once | INTRAMUSCULAR | Status: DC

## 2017-01-18 MED ORDER — COCONUT OIL OIL: 1.0000 "application " | TOPICAL_OIL | Status: DC | PRN

## 2017-01-18 MED ORDER — OXYCODONE-ACETAMINOPHEN 5-325 MG PO TABS
1.0000 | ORAL_TABLET | ORAL | Status: DC | PRN
  Filled 2017-01-18: qty 1

## 2017-01-18 MED ORDER — FENTANYL 2.5 MCG/ML BUPIVACAINE 1/10 % EPIDURAL INFUSION (WH - ANES)
14.0000 mL/h | INTRAMUSCULAR | Status: DC | PRN
  Administered 2017-01-18: 14 mL/h via EPIDURAL
  Filled 2017-01-18: qty 100

## 2017-01-18 MED ORDER — ZOLPIDEM TARTRATE 5 MG PO TABS: 5.0000 mg | ORAL_TABLET | Freq: Every evening | ORAL | Status: DC | PRN

## 2017-01-18 MED ORDER — IBUPROFEN 800 MG PO TABS
800.0000 mg | ORAL_TABLET | Freq: Three times a day (TID) | ORAL | Status: DC | PRN
  Administered 2017-01-19 – 2017-01-20 (×5): 800 mg via ORAL
  Filled 2017-01-18 (×5): qty 1

## 2017-01-18 MED ORDER — LACTATED RINGERS IV SOLN: 500.0000 mL | Freq: Once | INTRAVENOUS | Status: DC

## 2017-01-18 MED ORDER — DIPHENHYDRAMINE HCL 25 MG PO CAPS: 25.0000 mg | ORAL_CAPSULE | Freq: Four times a day (QID) | ORAL | Status: DC | PRN

## 2017-01-18 MED ORDER — OXYCODONE-ACETAMINOPHEN 5-325 MG PO TABS
2.0000 | ORAL_TABLET | ORAL | Status: DC | PRN
Start: 2017-01-18 — End: 2017-01-20

## 2017-01-18 MED ORDER — PHENYLEPHRINE 40 MCG/ML (10ML) SYRINGE FOR IV PUSH (FOR BLOOD PRESSURE SUPPORT)
80.0000 ug | PREFILLED_SYRINGE | INTRAVENOUS | Status: DC | PRN
  Filled 2017-01-18: qty 5

## 2017-01-18 MED ORDER — MEASLES, MUMPS & RUBELLA VAC ~~LOC~~ INJ
0.5000 mL | INJECTION | Freq: Once | SUBCUTANEOUS | Status: DC
  Filled 2017-01-18: qty 0.5

## 2017-01-18 MED ORDER — WITCH HAZEL-GLYCERIN EX PADS: 1.0000 "application " | MEDICATED_PAD | CUTANEOUS | Status: DC | PRN

## 2017-01-18 MED ORDER — ONDANSETRON HCL 4 MG PO TABS
4.0000 mg | ORAL_TABLET | ORAL | Status: DC | PRN
  Filled 2017-01-18: qty 1

## 2017-01-18 MED ORDER — BISACODYL 10 MG RE SUPP: 10.0000 mg | Freq: Every day | RECTAL | Status: DC | PRN

## 2017-01-18 MED ORDER — PHENYLEPHRINE 40 MCG/ML (10ML) SYRINGE FOR IV PUSH (FOR BLOOD PRESSURE SUPPORT)
80.0000 ug | PREFILLED_SYRINGE | INTRAVENOUS | Status: DC | PRN
  Filled 2017-01-18: qty 5
  Filled 2017-01-18: qty 10

## 2017-01-18 MED ORDER — TERBUTALINE SULFATE 1 MG/ML IJ SOLN: 0.2500 mg | Freq: Once | INTRAMUSCULAR | Status: DC | PRN

## 2017-01-18 MED ORDER — SENNOSIDES-DOCUSATE SODIUM 8.6-50 MG PO TABS
2.0000 | ORAL_TABLET | ORAL | Status: DC
  Administered 2017-01-19 – 2017-01-20 (×2): 2 via ORAL
  Filled 2017-01-18 (×2): qty 2

## 2017-01-18 MED ORDER — MISOPROSTOL 25 MCG QUARTER TABLET
25.0000 ug | ORAL_TABLET | ORAL | Status: DC
  Administered 2017-01-18: 25 ug via VAGINAL
  Filled 2017-01-18 (×5): qty 1

## 2017-01-18 MED ORDER — ONDANSETRON HCL 4 MG/2ML IJ SOLN: 4.0000 mg | INTRAMUSCULAR | Status: DC | PRN

## 2017-01-18 NOTE — Anesthesia Preprocedure Evaluation (Signed)
Anesthesia Evaluation  Patient identified by MRN, date of birth, ID band Patient awake    Reviewed: Allergy & Precautions, Patient's Chart, lab work & pertinent test results  Airway Mallampati: II  TM Distance: >3 FB Neck ROM: Full    Dental no notable dental hx. (+) Teeth Intact   Pulmonary neg pulmonary ROS,    Pulmonary exam normal breath sounds clear to auscultation       Cardiovascular negative cardio ROS Normal cardiovascular exam Rhythm:Regular Rate:Normal     Neuro/Psych negative neurological ROS  negative psych ROS   GI/Hepatic Neg liver ROS, hiatal hernia, GERD  Medicated and Controlled,  Endo/Other  Obesity  Renal/GU negative Renal ROS  negative genitourinary   Musculoskeletal negative musculoskeletal ROS (+)   Abdominal (+) + obese,   Peds  Hematology Thrombocytopenia-mild   Anesthesia Other Findings   Reproductive/Obstetrics (+) Pregnancy                             Anesthesia Physical Anesthesia Plan  ASA: II  Anesthesia Plan: Epidural   Post-op Pain Management:    Induction:   PONV Risk Score and Plan:   Airway Management Planned: Natural Airway  Additional Equipment:   Intra-op Plan:   Post-operative Plan:   Informed Consent: I have reviewed the patients History and Physical, chart, labs and discussed the procedure including the risks, benefits and alternatives for the proposed anesthesia with the patient or authorized representative who has indicated his/her understanding and acceptance.     Plan Discussed with: Anesthesiologist  Anesthesia Plan Comments:         Anesthesia Quick Evaluation

## 2017-01-18 NOTE — Progress Notes (Signed)
Still closed, will start pit per protocol 1000, FHR cat I

## 2017-01-18 NOTE — Anesthesia Procedure Notes (Signed)
Epidural Patient location during procedure: OB Start time: 01/18/2017 3:49 PM  Staffing Anesthesiologist: Mal AmabileFoster, Christie Abramo, MD Performed: anesthesiologist   Preanesthetic Checklist Completed: patient identified, site marked, surgical consent, pre-op evaluation, timeout performed, IV checked, risks and benefits discussed and monitors and equipment checked  Epidural Patient position: sitting Prep: site prepped and draped and DuraPrep Patient monitoring: continuous pulse ox and blood pressure Approach: midline Location: L4-L5 Injection technique: LOR air  Needle:  Needle type: Tuohy  Needle gauge: 17 G Needle length: 9 cm and 9 Needle insertion depth: 5 cm cm Catheter type: closed end flexible Catheter size: 19 Gauge Catheter at skin depth: 10 cm Test dose: negative and Other  Assessment Events: blood not aspirated, injection not painful, no injection resistance, negative IV test and no paresthesia  Additional Notes Patient identified. Risks and benefits discussed including failed block, incomplete  Pain control, post dural puncture headache, nerve damage, paralysis, blood pressure Changes, nausea, vomiting, reactions to medications-both toxic and allergic and post Partum back pain. All questions were answered. Patient expressed understanding and wished to proceed. Sterile technique was used throughout procedure. Epidural site was Dressed with sterile barrier dressing. No paresthesias, signs of intravascular injection were noted blood noted in catheter. Epidural replaced at L4-5 as above.  No signs of intravascular or inthrathecal injection. Patient was more comfortable after the epidural was dosed. Please see RN's note for documentation of vital signs and FHR which are stable.

## 2017-01-18 NOTE — Progress Notes (Signed)
SROM , now on pit 5916mu/min, now 2/80/-2, req epid

## 2017-01-18 NOTE — Progress Notes (Signed)
Discussed options of pain management with patient. Patient appears indecisive and states she feels anxious regarding which option to choose. Will continue to monitor.

## 2017-01-19 ENCOUNTER — Other Ambulatory Visit: Payer: Self-pay

## 2017-01-19 LAB — CBC
HCT: 38.3 % (ref 36.0–46.0)
HEMOGLOBIN: 12.8 g/dL (ref 12.0–15.0)
MCH: 29.5 pg (ref 26.0–34.0)
MCHC: 33.4 g/dL (ref 30.0–36.0)
MCV: 88.2 fL (ref 78.0–100.0)
Platelets: 121 10*3/uL — ABNORMAL LOW (ref 150–400)
RBC: 4.34 MIL/uL (ref 3.87–5.11)
RDW: 14.8 % (ref 11.5–15.5)
WBC: 17.5 10*3/uL — ABNORMAL HIGH (ref 4.0–10.5)

## 2017-01-19 MED ORDER — SODIUM CHLORIDE 0.9% FLUSH: 3.0000 mL | INTRAVENOUS | Status: DC | PRN

## 2017-01-19 MED ORDER — LACTATED RINGERS IV SOLN
INTRAVENOUS | Status: DC
  Administered 2017-01-19: 18:00:00 via INTRAVENOUS

## 2017-01-19 MED ORDER — MAGNESIUM SULFATE 40 G IN LACTATED RINGERS - SIMPLE
2.0000 g/h | INTRAVENOUS | Status: DC
  Administered 2017-01-19: 2 g/h via INTRAVENOUS
  Filled 2017-01-19: qty 500
  Filled 2017-01-19: qty 40

## 2017-01-19 NOTE — Progress Notes (Addendum)
2000: Pt seen awake in bed, spouse and other family members at bedside. Baby Susette RacerHardy was held by pt's sister-in-law. Pt denies pain at this time, but reported having blurred vision. Denies headache or epigastric pain. Relex 3+ with 1+ pitting edema to bilateral feet.  We D/C Magnesium and LR as ordered. Pts B/P 158/101 HR 87 temp 97.8 02 sat 99% R/A. We will reassess B/P in 1 hr.  0630: We continue to support patient with breast feeding. Pt's breast is soft, non-tender and is producing colostrum.She is able to hand express but baby often uses feeding time for comfort. Baby Mantel latches and sucks well whenever I am present. We will ask Lactation to reassess to see if baby Susette RacerHardy is transferring milk.

## 2017-01-19 NOTE — Progress Notes (Signed)
Post Partum Day 1 Subjective: no complaints  Objective: Blood pressure (!) 158/94, pulse 88, temperature 97.8 F (36.6 C), temperature source Oral, resp. rate 20, height 5\' 4"  (1.626 m), weight 195 lb (88.5 kg), last menstrual period 04/22/2016, SpO2 96 %, unknown if currently breastfeeding.  Physical Exam:  General: alert Lochia: appropriate Uterine Fundus: firm Incision: healing well DVT Evaluation: No evidence of DVT seen on physical exam.  Recent Labs    01/18/17 2105 01/19/17 0529  HGB 13.0 12.8  HCT 37.7 38.3   CBC    Component Value Date/Time   WBC 17.5 (H) 01/19/2017 0529   RBC 4.34 01/19/2017 0529   HGB 12.8 01/19/2017 0529   HCT 38.3 01/19/2017 0529   PLT 121 (L) 01/19/2017 0529   MCV 88.2 01/19/2017 0529   MCH 29.5 01/19/2017 0529   MCHC 33.4 01/19/2017 0529   RDW 14.8 01/19/2017 0529    Assessment/Plan: Plan for discharge tomorrow   D/C MgSO4 after 24 hrs   LOS: 2 days   Christie [name] M 01/19/2017, 8:45 AM

## 2017-01-19 NOTE — Anesthesia Postprocedure Evaluation (Signed)
Anesthesia Post Note  Patient: Christie [name]  Procedure(s) Performed: AN AD HOC LABOR EPIDURAL     Anesthesia Type: Epidural Level of consciousness: awake, oriented and awake and alert Pain management: satisfactory to patient Vital Signs Assessment: post-procedure vital signs reviewed and stable Respiratory status: spontaneous breathing and nonlabored ventilation Cardiovascular status: blood pressure returned to baseline and stable Postop Assessment: no headache, no backache, epidural receding, patient able to bend at knees, no apparent nausea or vomiting and adequate PO intake Anesthetic complications: no Comments: Pt interviewed in room 316    Last Vitals:  Vitals:   01/19/17 0654 01/19/17 0809  BP: (!) 144/95 (!) 158/94  Pulse: 88 88  Resp: 20 20  Temp: 36.6 C 36.6 C  SpO2: 99% 96%    Last Pain:  Vitals:   01/19/17 0809  TempSrc: Oral  PainSc:    Pain Goal: Patients Stated Pain Goal: 0 (01/18/17 0027)               Mauricia AreaWINN,Saleem Coccia

## 2017-01-19 NOTE — Lactation Note (Signed)
This note was copied from a baby's chart. Lactation Consultation Note Baby 11 hours old, jittery, bld. Sugar serum 35. Hand expression 1 ml thick colostrum spoon fed. Baby has no interest in BF. Put to breast w/no attempts to BF. Mom holding baby STS. Moms breast are full feeling, has generalized edema. Reverse pressure to areola helpful.  Mom has very short shaft, cone shaped breast. Mom stated nipples aren't much different w/o all the swelling. Fitted #20 NS. Taught application. Shells given, mom will put bra on later. Strongly encouraged.  Mom shown how to use DEBP & how to disassemble, clean, & reassemble parts. Mom knows to pump q3h for 15-20 min.  Baby has recessed chin. Discussed chin tug.  Newborn behavior discussed, I&O, STS, cluster feeding, supply and demand, and positioning. Reported to CN RN WH/LC brochure given w/resources, support groups and LC services. Patient Name: Christie [name] Reason for consult: Initial assessment;Early term 37-38.6wks;Infant < 6lbs;Other (Comment)(hypoglycemia)   Maternal Data Has patient been taught Hand Expression?: Yes Does the patient have breastfeeding experience prior to this delivery?: No  Feeding Feeding Type: Breast Milk Length of feed: 0 min  LATCH Score Latch: Too sleepy or reluctant, no latch achieved, no sucking elicited.  Audible Swallowing: None  Type of Nipple: Flat  Comfort (Breast/Nipple): Filling, red/small blisters or bruises, mild/mod discomfort(edema to breast, not very compressible)  Hold (Positioning): Full assist, staff holds infant at breast  LATCH Score: 2  Interventions Interventions: Breast feeding basics reviewed;Reverse pressure;Assisted with latch;Breast compression;Shells;Skin to skin;Adjust position;Breast massage;Support pillows;Hand pump;Hand express;Position options;DEBP;Pre-pump if needed;Expressed milk  Lactation Tools Discussed/Used Tools: Shells;Pump;Nipple  Shields Nipple shield size: 20 Shell Type: Inverted Breast pump type: Double-Electric Breast Pump;Manual Pump Review: Setup, frequency, and cleaning;Milk Storage Initiated by:: Peri JeffersonL. Freyja Govea RN IBCLC Date initiated:: 01/19/17   Consult Status Consult Status: Follow-up Date: 01/19/17 Follow-up type: In-patient    Charyl DancerCARVER, Christie [name] [name], 7:42 AM

## 2017-01-19 NOTE — Lactation Note (Signed)
This note was copied from a baby's chart. Lactation Consultation Note: Called by RN to assist mom with feeding. Baby has been sleepy now 15 hours old. Mom easily able to hand express Colostrum. Baby would take a few sucks then off the breast. Would lick Colostrum off the breast. Finally woke more and did better. Mom spoon fed about 3 ml to baby. Assisted with DEBP- mom pumping as I left room. No questions at present. Mom very sleepy will need review  Patient Name: Boy Jamey ReasSara Bewley WGNFA'OToday's Date: 01/19/2017 Reason for consult: Follow-up assessment   Maternal Data Formula Feeding for Exclusion: No Has patient been taught Hand Expression?: Yes Does the patient have breastfeeding experience prior to this delivery?: No  Feeding Feeding Type: Breast Fed Length of feed: 15 min  LATCH Score Latch: Repeated attempts needed to sustain latch, nipple held in mouth throughout feeding, stimulation needed to elicit sucking reflex.  Audible Swallowing: A few with stimulation  Type of Nipple: Everted at rest and after stimulation  Comfort (Breast/Nipple): Soft / non-tender  Hold (Positioning): Assistance needed to correctly position infant at breast and maintain latch.  LATCH Score: 7  Interventions Interventions: Breast feeding basics reviewed;Assisted with latch;Hand express;Position options  Lactation Tools Discussed/Used WIC Program: No Pump Review: Setup, frequency, and cleaning   Consult Status Consult Status: Follow-up Date: 01/20/17 Follow-up type: In-patient    Pamelia HoitWeeks, Kaelene Elliston D 01/19/2017, 10:50 AM

## 2017-01-20 ENCOUNTER — Ambulatory Visit: Payer: Self-pay

## 2017-01-20 MED ORDER — IBUPROFEN 800 MG PO TABS: 800.0000 mg | ORAL_TABLET | Freq: Three times a day (TID) | ORAL | 0 refills | Status: DC | PRN

## 2017-01-20 NOTE — Lactation Note (Signed)
This note was copied from a baby's chart. Lactation Consultation Note  Patient Name: Christie [name] ZOXWR'UToday's Date: 01/20/2017 Reason for consult: Follow-up assessment;Infant weight loss;1st time breastfeeding;Primapara;Early term 37-38.6wks;Difficult latch;Other (Comment)(8% weight loss, less than 6 pounds today , areola edema , NS needed to latch )  Baby is 39 hours old  At 24 hours bili 5.9. Post circ.  When LC entered the room, baby asleep in the bedside crib, and per mom recently came back from the nursery.  LC 1st discussed with mom what the baby's feedings have been like. Per mom the baby has been latching, sucks a few times and goes to sleep.  And I have only pumped x1 in the last 24 hours, only hand expressed.  LC asked moms permission to assess breast tissue due to mom feeling the baby wasn't latching deeply enough.  LC noted bilateral areola edema, and nipples pink in color, no breakdown.  Mom was instructed on shells , NS use , DEBP yesterday and has not used the shells, NS or pumping since.  LC [facility] plan for swollen semi compressible areolas. Since the last feeding was very short per dad, LC checked baby's diaper, dry, and assisted with latch, 2ml of EBM  available and instilled in the top of the NS for appetizer and baby latched with depth, and fed for 15 mins, swallows noted  And colostrum in the NS after the baby released, falling asleep. When LC went to place him the crib, baby woke up and LC assisted To latch on the right side, few sucks and fell asleep.  Baby STS with mom, dad plans to dress and wrap in blanket, and mom will post pump.  LC wrote LC plan on her board in the room.   LC is giving mom the opportunity to pump both breast / for EBM / prior to introducing formula for supplementing.   LC Plan - Breast shells between feedings Prior to latch - breast massage, hand express, pre-pump to make the nipple and areola more elastic- reverse pressure,  Apply NS ,  instill EBM in the top of the NS, feed the baby STS in football as shown,  Feed for 15 - 20 mins , offer 2nd breast , post  Pump for 15 - 20 mins , save milk for next feeding.   HROB RN [facility] of the [facility]/ was present at the time of the consult . Also that the #20 NS is to small, and the #24 NS should be used.   Per Dr.kate  Effefagh - to supplement baby after breast feeding, due to weight loss    Maternal Data Has patient been taught Hand Expression?: Yes([facility] had expressed off 2ml [name] )  Feeding Feeding Type: Breast Fed(left breast / football ) Length of feed: 15 min(several swallows noted )  LATCH Score Latch: Grasps breast easily, tongue down, lips flanged, rhythmical sucking.  Audible Swallowing: Spontaneous and intermittent  Type of Nipple: Everted at rest and after stimulation(areola edema )  Comfort (Breast/Nipple): Soft / non-tender  Hold (Positioning): Assistance needed to correctly position infant at breast and maintain latch.  LATCH Score: 9  Interventions Interventions: Breast feeding basics reviewed;Assisted with latch;Skin to skin;Breast massage;Hand express;Pre-pump if needed;Reverse pressure;Breast compression;Adjust position;Support pillows;Position options;Expressed milk;Shells;DEBP;Hand pump  Lactation Tools Discussed/Used Tools: Shells;Pump;Nipple Shields Nipple shield size: 20;Other (comment);24(#20 NS to small , #24 NS fits better ) Shell Type: Inverted Breast pump type: Double-Electric Breast Pump;Manual Pump Review: Setup, frequency,  and cleaning   Consult Status Consult Status: Follow-up Date: 01/20/17 Follow-up type: In-patient    Christie [name] 01/20/2017, 10:24 AM

## 2017-01-20 NOTE — Lactation Note (Signed)
This note was copied from a baby's chart. Lactation Consultation Note  Patient Name: Christie Jamey ReasSara [name] AVWUJ'WToday's Date: 01/20/2017 Reason for consult: Follow-up assessment;Other (Comment)(2nd visit for LC - to evaluate supplementing , and discussion , see LC note )  2nd LC visit for this dyad and dad.  Mom had post pumped after 2 feedings and the amount has increased.  LC informed the NP Myrene BuddyJenny Riddle and asked if the amounts were increasing and the baby  is latching more consistent would it be ok to continue  With EBM. Boneta LucksJenny recommended following the LPT supplementing guidelines, continue breastfeeding , supplementing, if the EBM  Volume doesn't increase To feed a combination of EBM and formula, or if the EBM isn't available formula.  LC discussed this with mom and dad , praised them both for their hard work, and stressed the importance of keeping the baby's energy level up so the baby  Is effective feeding. Especially since the baby is at 8% weight loss , and less than 6 pounds today.  Both mom and dad receptive to plan discussed.  LC will follow dyad tomorrow.  HROB RN aware of plan as discussed , present in the room [facility].    Maternal Data    Feeding Feeding Type: Breast Fed Length of feed: 40 min(15 min on one breast and 25 on the other)  LATCH Score                   Interventions Interventions: Breast feeding basics reviewed  Lactation Tools Discussed/Used     Consult Status Consult Status: Follow-up Date: 01/21/17 Follow-up type: In-patient    Christie [name] 01/20/2017, 4:36 PM

## 2017-01-20 NOTE — Progress Notes (Signed)
Discharge instructions given to pt and s/o. Educated pt on post-partum care. Also educated her on the signs/symptoms she needs to be on the look-out for and when to report to the doctor. Pt verbalizes understanding and has no further questions or concerns at this time. IV taken out and tolerated well.

## 2017-01-20 NOTE — Discharge Summary (Signed)
Obstetric Discharge Summary Reason for Admission: induction of labor Prenatal Procedures: none Intrapartum Procedures: spontaneous vaginal delivery Postpartum Procedures: none Complications-Operative and Postpartum: none Hemoglobin  Date Value Ref Range Status  01/19/2017 12.8 12.0 - 15.0 g/dL Final   HCT  Date Value Ref Range Status  01/19/2017 38.3 36.0 - 46.0 % Final    Physical Exam:  General: alert Lochia: appropriate Uterine Fundus: firm Incision: healing well DVT Evaluation: No evidence of DVT seen on physical exam.  Discharge Diagnoses: Term Pregnancy-delivered and Preelampsia  Discharge Information: Date: 01/20/2017 Activity: pelvic rest Diet: routine Medications: PNV and Ibuprofen Condition: stable Instructions: refer to practice specific booklet Discharge to: home Follow-up Information    [facility] Schedule an appointment as soon as possible for a visit in 5 day(s).   Why:  for BP chk Contact information: [address] [place] [address] [phone]           Newborn Data: Live born female  Birth Weight: 6 lb 0.5 oz (2735 g) APGAR: 8, 9  Newborn Delivery   Birth date/time:  01/18/2017 19:13:00 Delivery type:  Vaginal, Spontaneous     Home with mother.  Christie PicaHOLLAND,Christie [name] 01/20/2017, 8:33 AM

## 2017-01-21 ENCOUNTER — Ambulatory Visit: Payer: Self-pay

## 2017-01-21 NOTE — Lactation Note (Signed)
This note was copied from a baby's chart. Lactation Consultation Note  Patient Name: Boy Jamey ReasSara Athanas ZOXWR'UToday's Date: 01/21/2017 Reason for consult: Follow-up assessment;Infant weight loss;Early term 37-38.6wks;Other (Comment)(9% weight loss, less than 6 pounds , milk is in )  Baby is 65 hours old  LC reviewed doc flow sheets  [facility] and dad is very supportive  Baby awake and hungry , baby latched well with the #24 NS , and 1 ml instilled  in to the top and fed for 10 mins ,  multiple swallows, increased with breast compressions  Since the baby is at 9% weight loss , LC concerned baby may be non - nutritive at the breast when he has been  having  these long feedings.  LC recommended feeding the baby 15 -20 mins 1st breast , and supplementing up to 30 ml of EBM now that the milk is in.  @ this consult showed mom and dad how to PACE feed. Baby at 1st had a difficult time latching on to the artifical nipple.  Baby took 18 ml of EBM , and no spitting,.  Sore nipple and engorgement prevention and tx reviewed.  Per mom will have a DEBP at home - Medela.  [facility] reviewed with the use of a Nipple Shield.  [facility].  LC mentioned if they are unable to make appt to [facility] due to 9 % weight loss and use of the NS.  Mother informed of post-discharge support and given phone number to the lactation department, including services for phone call  assistance; out-patient appointments; and breastfeeding support group. List of other breastfeeding resources in the community given  in the handout. Encouraged mother to call for problems or concerns related to breastfeeding.  Both mom and dad receptive to LP plan of care.     Maternal Data Has patient been taught Hand Expression?: Yes  Feeding Feeding Type: Breast Fed Length of feed: 10 min(multiple swallows, increased with breast compressions )  LATCH  Score Latch: Grasps breast easily, tongue down, lips flanged, rhythmical sucking.  Audible Swallowing: Spontaneous and intermittent  Type of Nipple: Everted at rest and after stimulation  Comfort (Breast/Nipple): Filling, red/small blisters or bruises, mild/mod discomfort  Hold (Positioning): Assistance needed to correctly position infant at breast and maintain latch.  LATCH Score: 8  Interventions Interventions: Breast feeding basics reviewed;Assisted with latch;Skin to skin;Breast massage;Hand express;Pre-pump if needed;Reverse pressure;Breast compression;Adjust position;Support pillows;Position options;Expressed milk;Shells;Comfort gels;Hand pump;DEBP  Lactation Tools Discussed/Used Tools: Shells;Pump;Nipple Shields Nipple shield size: 24 Shell Type: Inverted Breast pump type: Double-Electric Breast Pump   Consult Status Consult Status: Follow-up Date: 01/21/17 Follow-up type: In-patient    Matilde SprangMargaret Ann Zeenat Jeanbaptiste 01/21/2017, 12:37 PM

## 2017-01-29 ENCOUNTER — Inpatient Hospital Stay (HOSPITAL_COMMUNITY): Payer: BLUE CROSS/BLUE SHIELD

## 2018-03-31 ENCOUNTER — Encounter: Payer: BLUE CROSS/BLUE SHIELD | Admitting: Physician Assistant

## 2018-04-04 ENCOUNTER — Encounter: Payer: Self-pay | Admitting: Physician Assistant

## 2018-04-04 ENCOUNTER — Ambulatory Visit: Payer: 59 | Admitting: Physician Assistant

## 2018-04-04 VITALS — BP 120/84 | HR 81 | Temp 98.1°F | Resp 16 | Wt 175.0 lb

## 2018-04-04 DIAGNOSIS — R300 Dysuria: Secondary | ICD-10-CM | POA: Diagnosis not present

## 2018-04-04 DIAGNOSIS — N3000 Acute cystitis without hematuria: Secondary | ICD-10-CM | POA: Diagnosis not present

## 2018-04-04 DIAGNOSIS — Z3200 Encounter for pregnancy test, result unknown: Secondary | ICD-10-CM | POA: Diagnosis not present

## 2018-04-04 LAB — POCT URINE PREGNANCY: PREG TEST UR: NEGATIVE

## 2018-04-04 MED ORDER — SULFAMETHOXAZOLE-TRIMETHOPRIM 800-160 MG PO TABS: 1.0000 | ORAL_TABLET | Freq: Two times a day (BID) | ORAL | 0 refills | Status: DC

## 2018-04-04 NOTE — Patient Instructions (Signed)
Urinary Tract Infection, Adult A urinary tract infection (UTI) is an infection of any part of the urinary tract. The urinary tract includes:  The kidneys.  The ureters.  The bladder.  The urethra. These organs make, store, and get rid of pee (urine) in the body. What are the causes? This is caused by germs (bacteria) in your genital area. These germs grow and cause swelling (inflammation) of your urinary tract. What increases the risk? You are more likely to develop this condition if:  You have a small, thin tube (catheter) to drain pee.  You cannot control when you pee or poop (incontinence).  You are female, and: ? You use these methods to prevent pregnancy: ? A medicine that kills sperm (spermicide). ? A device that blocks sperm (diaphragm). ? You have low levels of a female hormone (estrogen). ? You are pregnant.  You have genes that add to your risk.  You are sexually active.  You take antibiotic medicines.  You have trouble peeing because of: ? A prostate that is bigger than normal, if you are female. ? A blockage in the part of your body that drains pee from the bladder (urethra). ? A kidney stone. ? A nerve condition that affects your bladder (neurogenic bladder). ? Not getting enough to drink. ? Not peeing often enough.  You have other conditions, such as: ? Diabetes. ? A weak disease-fighting system (immune system). ? Sickle cell disease. ? Gout. ? Injury of the spine. What are the signs or symptoms? Symptoms of this condition include:  Needing to pee right away (urgently).  Peeing often.  Peeing small amounts often.  Pain or burning when peeing.  Blood in the pee.  Pee that smells bad or not like normal.  Trouble peeing.  Pee that is cloudy.  Fluid coming from the vagina, if you are female.  Pain in the belly or lower back. Other symptoms include:  Throwing up (vomiting).  No urge to eat.  Feeling mixed up (confused).  Being tired  and grouchy (irritable).  A fever.  Watery poop (diarrhea). How is this treated? This condition may be treated with:  Antibiotic medicine.  Other medicines.  Drinking enough water. Follow these instructions at home:  Medicines  Take over-the-counter and prescription medicines only as told by your doctor.  If you were prescribed an antibiotic medicine, take it as told by your doctor. Do not stop taking it even if you start to feel better. General instructions  Make sure you: ? Pee until your bladder is empty. ? Do not hold pee for a long time. ? Empty your bladder after sex. ? Wipe from front to back after pooping if you are a female. Use each tissue one time when you wipe.  Drink enough fluid to keep your pee pale yellow.  Keep all follow-up visits as told by your doctor. This is important. Contact a doctor if:  You do not get better after 1-2 days.  Your symptoms go away and then come back. Get help right away if:  You have very bad back pain.  You have very bad pain in your lower belly.  You have a fever.  You are sick to your stomach (nauseous).  You are throwing up. Summary  A urinary tract infection (UTI) is an infection of any part of the urinary tract.  This condition is caused by germs in your genital area.  There are many risk factors for a UTI. These include having a small, thin   tube to drain pee and not being able to control when you pee or poop.  Treatment includes antibiotic medicines for germs.  Drink enough fluid to keep your pee pale yellow. This information is not intended to replace advice given to you by your health care provider. Make sure you discuss any questions you have with your health care provider. Document Released: 08/15/2007 Document Revised: 09/05/2017 Document Reviewed: 09/05/2017 Elsevier Interactive Patient Education  2019 Elsevier Inc.  

## 2018-04-04 NOTE — Progress Notes (Signed)
Patient: Christie [name] Female    DOB: 03-28-1985   32 y.o.   MRN: [id number] Visit Date: 04/04/2018  Today's Provider: Margaretann Loveless, PA-C   Chief Complaint  Patient presents with  . Dysuria   Subjective:     Dysuria   This is a new problem. The current episode started in the past 7 days (5 days). The problem has been gradually worsening. The quality of the pain is described as burning and aching. The pain is moderate. There has been no fever. Associated symptoms include flank pain, frequency, hematuria, a possible pregnancy and urgency. Pertinent negatives include no chills, discharge, hesitancy, nausea, sweats or vomiting. Treatments tried: AZO. The treatment provided mild relief.    Patient has had burning upon urination for 5 days. Patient also has symptoms of flank pain, abdominal pain, frequency, urgency, and some blood in urine. Patient has been taking otc AZO with mild relief.   No Known Allergies   Current Outpatient Medications:  .  ibuprofen (ADVIL,MOTRIN) 800 MG tablet, Take 1 tablet (800 mg total) every 8 (eight) hours as needed by mouth for moderate pain., Disp: 30 tablet, Rfl: 0 .  Prenatal Vit-Fe Fumarate-FA (PRENATAL MULTIVITAMIN) TABS tablet, Take 1 tablet at bedtime by mouth. , Disp: , Rfl:   Review of Systems  Constitutional: Negative for appetite change, chills, fatigue and fever.  Respiratory: Negative for chest tightness and shortness of breath.   Cardiovascular: Negative for chest pain and palpitations.  Gastrointestinal: Negative for abdominal pain, nausea and vomiting.  Genitourinary: Positive for dysuria, flank pain, frequency, hematuria and urgency. Negative for hesitancy.  Neurological: Negative for dizziness and weakness.    Social History   Tobacco Use  . Smoking status: Never Smoker  . Smokeless tobacco: Never Used  Substance Use Topics  . Alcohol use: No    Alcohol/week: 0.0 standard drinks      Objective:   BP 120/84 (BP  Location: Left Arm, Patient Position: Sitting, Cuff Size: Large)   Pulse 81   Temp 98.1 F (36.7 C) (Oral)   Resp 16   Wt 175 lb (79.4 kg)   LMP 03/17/2018   SpO2 97%   BMI 30.04 kg/m  Vitals:   04/04/18 1625  BP: 120/84  Pulse: 81  Resp: 16  Temp: 98.1 F (36.7 C)  TempSrc: Oral  SpO2: 97%  Weight: 175 lb (79.4 kg)     Physical Exam Constitutional:      General: She is not in acute distress.    Appearance: Normal appearance. She is well-developed. She is not diaphoretic.  Cardiovascular:     Rate and Rhythm: Normal rate and regular rhythm.     Heart sounds: Normal heart sounds. No murmur. No friction rub. No gallop.   Pulmonary:     Effort: Pulmonary effort is normal. No respiratory distress.     Breath sounds: Normal breath sounds. No wheezing or rales.  Abdominal:     General: Bowel sounds are normal. There is no distension.     Palpations: Abdomen is soft. There is no mass.     Tenderness: There is abdominal tenderness in the suprapubic area. There is no guarding or rebound.  Skin:    General: Skin is warm and dry.  Neurological:     Mental Status: She is alert and oriented to person, place, and time.        Assessment & Plan    1. Acute cystitis without hematuria Worsening symptoms.  UA positive. Will treat empirically with Bactrim as below. Continue to push fluids. Urine sent for culture. Will follow up pending C&S results. She is to call if symptoms do not improve or if they worsen.  - sulfamethoxazole-trimethoprim (BACTRIM DS,SEPTRA DS) 800-160 MG tablet; Take 1 tablet by mouth 2 (two) times daily.  Dispense: 10 tablet; Refill: 0  2. Dysuria UA unable to be read due to AZO use. Culture will be obtained.  - CULTURE, URINE COMPREHENSIVE  3. Possible pregnancy Urine pregnancy negative. - POCT urine pregnancy     Margaretann Loveless, PA-C  [facility]

## 2018-04-09 LAB — CULTURE, URINE COMPREHENSIVE

## 2018-04-10 ENCOUNTER — Telehealth: Payer: Self-pay

## 2018-04-10 NOTE — Telephone Encounter (Signed)
-----   Message from Margaretann Loveless, [place] sent at 04/09/2018  5:04 PM EST ----- Urine culture was positive for lactobacillus. It is sensitive to the antibiotic you were placed on. Continue antibiotic until completed.

## 2018-04-10 NOTE — Telephone Encounter (Signed)
LMTCB

## 2018-04-13 ENCOUNTER — Ambulatory Visit (HOSPITAL_COMMUNITY)
Admission: EM | Admit: 2018-04-13 | Discharge: 2018-04-13 | Disposition: A | Discharge status: Home / Self Care | Payer: 59 | Attending: Family Medicine | Admitting: Family Medicine

## 2018-04-13 ENCOUNTER — Encounter (HOSPITAL_COMMUNITY): Payer: Self-pay | Admitting: Emergency Medicine

## 2018-04-13 DIAGNOSIS — L509 Urticaria, unspecified: Secondary | ICD-10-CM

## 2018-04-13 MED ORDER — PREDNISONE 20 MG PO TABS
ORAL_TABLET | ORAL | 0 refills | Status: DC
Start: 2018-04-13 — End: 2018-09-19

## 2018-04-13 MED ORDER — PREDNISONE 20 MG PO TABS: ORAL_TABLET | ORAL | 0 refills | Status: DC

## 2018-04-13 NOTE — ED Triage Notes (Addendum)
Pt c/o rash/allergic reaction since last night. Pt c/o itchiness. Pt recently finished bactrim yesterday.

## 2018-04-13 NOTE — Discharge Instructions (Addendum)
May take zyrtec 10 twice a day Take the benadryl 25-50 at night Take the prednisone as directed Take 3 tabs tonight Avoid sulfa ( bactrim, septra) Return as needed

## 2018-04-13 NOTE — ED Provider Notes (Signed)
[facility]    CSN: [id number] Arrival date & time: 04/13/18  1716     History   Chief Complaint Chief Complaint  Patient presents with  . Rash    HPI Christie [name] Christie [name] is a 33 y.o. female.   HPI Just finished a 5-day course of Bactrim yesterday for a bladder infection.  Today she developed hives all over her body and then upper lip swelling.  She called her primary care office and they told her she needed to be seen.  She is never had this reaction before.  She is having no shortness of breath.  No difficulty swallowing.  No change in voice.  She is taken 3 doses of Benadryl, with spreading of the rash and continued itching.  Otherwise healthy.  Does not think she is ever taken Bactrim before Past Medical History:  Diagnosis Date  . GERD (gastroesophageal reflux disease)     Patient Active Problem List   Diagnosis Date Noted  . Pregnancy 01/17/2017  . Hernia, hiatal 10/01/2014  . GERD (gastroesophageal reflux disease) 10/01/2014    Past Surgical History:  Procedure Laterality Date  . NO PAST SURGERIES    . WISDOM TOOTH EXTRACTION      OB History    Gravida  1   Para  1   Term  1   Preterm      AB      Living  1     SAB      TAB      Ectopic      Multiple  0   Live Births  1            Home Medications    Prior to Admission medications   Medication Sig Start Date End Date Taking? Authorizing Provider  predniSONE (DELTASONE) 20 MG tablet Take 3 tabs daily for 3 d then 2 tabs daily for a week then one tab daily for 3 d then dc 04/13/18   Belinda Fisher, PA-C    Family History Family History  Problem Relation Age of Onset  . Melanoma Mother   . Thyroid disease Mother   . Hypertension Mother     Social History Social History   Tobacco Use  . Smoking status: Never Smoker  . Smokeless tobacco: Never Used  Substance Use Topics  . Alcohol use: No    Alcohol/week: 0.0 standard drinks  . Drug use: No     Allergies   Bactrim  [sulfamethoxazole-trimethoprim]   Review of Systems Review of Systems  Constitutional: Negative for chills and fever.  HENT: Negative for ear pain and sore throat.   Eyes: Negative for pain and visual disturbance.  Respiratory: Negative for cough and shortness of breath.   Cardiovascular: Negative for chest pain and palpitations.  Gastrointestinal: Negative for abdominal pain and vomiting.  Genitourinary: Negative for dysuria and hematuria.  Musculoskeletal: Negative for arthralgias and back pain.  Skin: Positive for rash. Negative for color change.  Neurological: Negative for seizures and syncope.  All other systems reviewed and are negative.    Physical Exam Triage Vital Signs ED Triage Vitals  Enc Vitals Group     BP 04/13/18 1749 (!) 141/101     Pulse Rate 04/13/18 1749 (!) 115     Resp 04/13/18 1749 18     Temp 04/13/18 1749 98.5 F (36.9 C)     Temp src --      SpO2 04/13/18 1749 99 %  Weight --      Height --      Head Circumference --      Peak Flow --      Pain Score 04/13/18 1750 0     Pain Loc --      Pain Edu? --      Excl. in GC? --    No data found.  Updated Vital Signs BP (!) 141/101   Pulse (!) 115   Temp 98.5 F (36.9 C)   Resp 18   LMP 03/17/2018   SpO2 99%   Visual Acuity Right Eye Distance:   Left Eye Distance:   Bilateral Distance:    Right Eye Near:   Left Eye Near:    Bilateral Near:     Physical Exam Constitutional:      General: She is not in acute distress.    Appearance: She is well-developed.  HENT:     Head: Normocephalic and atraumatic.     Mouth/Throat:     Comments: Oropharynx benign.  Uvula midline. Eyes:     Conjunctiva/sclera: Conjunctivae normal.     Pupils: Pupils are equal, round, and reactive to light.  Neck:     Musculoskeletal: Normal range of motion.  Cardiovascular:     Rate and Rhythm: Normal rate.     Heart sounds: Normal heart sounds.  Pulmonary:     Effort: Pulmonary effort is normal. No  respiratory distress.     Breath sounds: Normal breath sounds.  Abdominal:     General: There is no distension.     Palpations: Abdomen is soft.  Musculoskeletal: Normal range of motion.  Skin:    General: Skin is warm and dry.     Coloration: Pallor:      Findings: Rash present. Rash is urticarial.       Neurological:     Mental Status: She is alert.      UC Treatments / Results  Labs (all labs ordered are listed, but only abnormal results are displayed) Labs Reviewed - No data to display  EKG None  Radiology No results found.  Procedures Procedures (including critical care time)  Medications Ordered in UC Medications - No data to display  Initial Impression / Assessment and Plan / UC Course  I have reviewed the triage vital signs and the nursing notes.  Pertinent labs & imaging results that were available during my care of the patient were reviewed by me and considered in my medical decision making (see chart for details).    Reviewed that that sulfamethoxazole is famous for this kind of urticarial rash. Gust treat with antihistamines, cool compresses, over-the-counter H2 blockers, prednisone if needed This can sometimes last for 2 weeks.  Giving her long course of steroids that she does not rebound Side effects of medication were reviewed Avoidance of medicine in the future Final Clinical Impressions(s) / UC Diagnoses   Final diagnoses:  Hives     Discharge Instructions     May take zyrtec 10 twice a day Take the benadryl 25-50 at night Take the prednisone as directed Take 3 tabs tonight Avoid sulfa ( bactrim, septra) Return as needed     Controlled Substance Prescriptions [place] Controlled Substance Registry consulted? Not Applicable   Eustace Moore, MD 04/13/18 [place] Moment

## 2018-04-14 ENCOUNTER — Telehealth: Payer: Self-pay

## 2018-04-14 NOTE — Telephone Encounter (Signed)
LMTCB calling patient to follow-up  FYI: [facility] 04/13/2018 with c/o allergic reaction/EPI PEN USED. Patient reported to them that she finished her abx on 02/01 (sulfamethox-trim) and started having really bad itching all over with hives/swelling and redness in her wrists, ears swollen lip rash all over. Patient reported to them that she had been taking Benadryl.

## 2018-04-14 NOTE — Telephone Encounter (Signed)
Noted  

## 2018-04-14 NOTE — Telephone Encounter (Signed)
Per pt chart patient was seen at the Urgent care and prescribed prednisone.

## 2018-04-14 NOTE — Telephone Encounter (Signed)
Patient finished antibiotic and had allergic reaction and was sent to urgent care per note.

## 2018-04-23 ENCOUNTER — Encounter: Payer: Self-pay | Admitting: Physician Assistant

## 2018-04-23 ENCOUNTER — Other Ambulatory Visit (HOSPITAL_COMMUNITY)
Admission: RE | Admit: 2018-04-23 | Discharge: 2018-04-23 | Disposition: A | Discharge status: Home / Self Care | Payer: 59 | Source: Ambulatory Visit | Attending: Oncology | Admitting: Oncology

## 2018-04-23 ENCOUNTER — Ambulatory Visit (INDEPENDENT_AMBULATORY_CARE_PROVIDER_SITE_OTHER): Payer: 59 | Admitting: Physician Assistant

## 2018-04-23 VITALS — BP 112/70 | HR 113 | Temp 98.2°F | Resp 16 | Ht 64.0 in | Wt 176.0 lb

## 2018-04-23 DIAGNOSIS — Z124 Encounter for screening for malignant neoplasm of cervix: Secondary | ICD-10-CM | POA: Diagnosis not present

## 2018-04-23 DIAGNOSIS — Z0000 Encounter for general adult medical examination without abnormal findings: Secondary | ICD-10-CM | POA: Diagnosis not present

## 2018-04-23 DIAGNOSIS — N6459 Other signs and symptoms in breast: Secondary | ICD-10-CM

## 2018-04-23 NOTE — Patient Instructions (Signed)

## 2018-04-23 NOTE — Progress Notes (Addendum)
Patient: Christie [name], Female    DOB: [birth year], 33 y.o.   MRN: [id number] Visit Date: 04/23/2018  Today's Provider: Margaretann Loveless, PA-C   Chief Complaint  Patient presents with  . Annual Exam   Subjective:     Annual physical exam NAILEA [name] is a 33 y.o. female who presents today for health maintenance and complete physical. She feels well. She reports exercising some. She reports she is sleeping well.  Tdap: 2018 when she was pregnant, [facility], Dr. Rana Snare.  -----------------------------------------------------------------   Review of Systems  Constitutional: Negative.   HENT: Negative.   Eyes: Negative.   Respiratory: Negative.   Cardiovascular: Negative.   Gastrointestinal: Positive for abdominal pain (chronic on right side for over 10 years).  Endocrine: Negative.   Genitourinary: Negative.   Musculoskeletal: Negative.   Skin: Negative.   Allergic/Immunologic: Negative.   Neurological: Negative.   Hematological: Negative.   Psychiatric/Behavioral: Negative.     Social History      She  reports that she has never smoked. She has never used smokeless tobacco. She reports that she does not drink alcohol or use drugs.       Social History   Socioeconomic History  . Marital status: Married    Spouse name: Not on file  . Number of children: Not on file  . Years of education: Not on file  . Highest education level: Not on file  Occupational History  . Not on file  Social Needs  . Financial resource strain: Not on file  . Food insecurity:    Worry: Not on file    Inability: Not on file  . Transportation needs:    Medical: Not on file    Non-medical: Not on file  Tobacco Use  . Smoking status: Never Smoker  . Smokeless tobacco: Never Used  Substance and Sexual Activity  . Alcohol use: No    Alcohol/week: 0.0 standard drinks  . Drug use: No  . Sexual activity: Not on file  Lifestyle  . Physical activity:    Days per week: Not  on file    Minutes per session: Not on file  . Stress: Not on file  Relationships  . Social connections:    Talks on phone: Not on file    Gets together: Not on file    Attends religious service: Not on file    Active member of club or organization: Not on file    Attends meetings of clubs or organizations: Not on file    Relationship status: Not on file  Other Topics Concern  . Not on file  Social History Narrative  . Not on file    Past Medical History:  Diagnosis Date  . GERD (gastroesophageal reflux disease)      Patient Active Problem List   Diagnosis Date Noted  . Pregnancy 01/17/2017  . Hernia, hiatal 10/01/2014  . GERD (gastroesophageal reflux disease) 10/01/2014    Past Surgical History:  Procedure Laterality Date  . NO PAST SURGERIES    . WISDOM TOOTH EXTRACTION      Family History        Family Status  Relation Name Status  . Mother  Alive  . Father  Alive        Her family history includes Hypertension in her mother; Melanoma in her mother; Thyroid disease in her mother.      Allergies  Allergen Reactions  . Bactrim [Sulfamethoxazole-Trimethoprim] Hives  Body hives and lip swelling     Current Outpatient Medications:  .  predniSONE (DELTASONE) 20 MG tablet, Take 3 tabs daily for 3 d then 2 tabs daily for a week then one tab daily for 3 d then dc, Disp: 26 tablet, Rfl: 0   Patient Care Team: Reine JustBurnette, Tashana Haberl M, PA-C as PCP - General (Physician Assistant)    Objective:    Vitals: BP 112/70 (BP Location: Left Arm, Patient Position: Sitting, Cuff Size: Large)   Pulse (!) 113   Temp 98.2 F (36.8 C) (Oral)   Resp 16   Ht 5\' 4"  (1.626 m)   Wt 176 lb (79.8 kg)   LMP 03/17/2018   SpO2 97%   BMI 30.21 kg/m    Vitals:   04/23/18 1558  BP: 112/70  Pulse: (!) 113  Resp: 16  Temp: 98.2 F (36.8 C)  TempSrc: Oral  SpO2: 97%  Weight: 176 lb (79.8 kg)  Height: 5\' 4"  (1.626 m)     Physical Exam Vitals signs reviewed.    Constitutional:      General: She is not in acute distress.    Appearance: Normal appearance. She is well-developed. She is not diaphoretic.  HENT:     Head: Normocephalic and atraumatic.     Right Ear: Hearing, tympanic membrane, ear canal and external ear normal.     Left Ear: Hearing, tympanic membrane, ear canal and external ear normal.     Nose: Nose normal.     Mouth/Throat:     Mouth: Mucous membranes are moist.     Pharynx: Oropharynx is clear. Uvula midline. No oropharyngeal exudate.  Eyes:     General: No scleral icterus.       Right eye: No discharge.        Left eye: No discharge.     Extraocular Movements: Extraocular movements intact.     Conjunctiva/sclera: Conjunctivae normal.     Pupils: Pupils are equal, round, and reactive to light.  Neck:     Musculoskeletal: Normal range of motion and neck supple.     Thyroid: No thyromegaly.     Vascular: No carotid bruit or JVD.     Trachea: No tracheal deviation.  Cardiovascular:     Rate and Rhythm: Normal rate and regular rhythm.     Pulses: Normal pulses.     Heart sounds: Normal heart sounds. No murmur. No friction rub. No gallop.   Pulmonary:     Effort: Pulmonary effort is normal. No respiratory distress.     Breath sounds: Normal breath sounds. No wheezing or rales.  Chest:     Chest wall: No tenderness.     Breasts: Breasts are symmetrical.        Right: Mass present. No inverted nipple, nipple discharge, skin change or tenderness.        Left: No inverted nipple, mass, nipple discharge, skin change or tenderness.    Abdominal:     General: Bowel sounds are normal. There is no distension.     Palpations: Abdomen is soft. There is no mass.     Tenderness: There is no abdominal tenderness. There is no guarding or rebound.     Hernia: There is no hernia in the right inguinal area or left inguinal area.  Genitourinary:    Exam position: Supine.     Labia:        Right: No rash, tenderness, lesion or injury.         Left: No  rash, tenderness, lesion or injury.      Vagina: No signs of injury. Vaginal discharge (some white, thick discharge noted) present. No erythema, tenderness or bleeding.     Cervix: No cervical motion tenderness, discharge or friability.     Adnexa:        Right: No mass, tenderness or fullness.         Left: No mass, tenderness or fullness.       Rectum: Normal.  Musculoskeletal: Normal range of motion.        General: No tenderness.  Lymphadenopathy:     Cervical: No cervical adenopathy.  Skin:    General: Skin is warm and dry.     Findings: No rash.  Neurological:     Mental Status: She is alert and oriented to person, place, and time.     Cranial Nerves: No cranial nerve deficit.     Coordination: Coordination normal.     Deep Tendon Reflexes: Reflexes are normal and symmetric.  Psychiatric:        Mood and Affect: Mood normal.        Behavior: Behavior normal.        Thought Content: Thought content normal.        Judgment: Judgment normal.      Depression Screen PHQ 2/9 Scores 04/23/2018  PHQ - 2 Score 0       Assessment & Plan:     Routine Health Maintenance and Physical Exam  Exercise Activities and Dietary recommendations Goals   None     There is no immunization history for the selected administration types on file for this patient.  Health Maintenance  Topic Date Due  . TETANUS/TDAP  07/07/2006  . PAP SMEAR-Modifier  07/06/2008  . INFLUENZA VACCINE  10/10/2017  . HIV Screening  Completed     Discussed health benefits of physical activity, and encouraged her to engage in regular exercise appropriate for her age and condition.    1. Annual physical exam Normal physical exam today. Will check labs as below and f/u pending lab results. If labs are stable and WNL she will not need to have these rechecked for one year at her next annual physical exam. She is to call the office in the meantime if she has any acute issue, questions or  concerns. - CBC with Differential/Platelet - Comprehensive metabolic panel - Hemoglobin A1c - Lipid panel - TSH  2. Cervical cancer screening Pap collected today. Will send as below and f/u pending results. - Cytology - PAP  3. Abnormal breast exam Small, pea-sized, hard, mobile nodule noted around 4 o clock approx 5cm from nipple line. There is a firm duct noted above it in 3 o clock line. No family history of breast cancer. Some mild tenderness throughout area.  - MM Digital Diagnostic Bilat; Future - US BREAST COMPLETE UNI RIGHT INC AXILLA; Future  --------------------------------------------------------------------    Margaretann Loveless, PA-C  [facility]

## 2018-04-25 NOTE — Addendum Note (Signed)
Addended by: Margaretann Loveless on: 04/25/2018 04:08 PM   Modules accepted: Orders

## 2018-04-28 ENCOUNTER — Telehealth: Payer: Self-pay

## 2018-04-28 LAB — CYTOLOGY - PAP
Adequacy: ABSENT
Diagnosis: NEGATIVE
HPV (WINDOPATH): NOT DETECTED

## 2018-04-28 NOTE — Telephone Encounter (Signed)
Tried calling patient. Left message to call back. 

## 2018-04-28 NOTE — Telephone Encounter (Signed)
-----   Message from Margaretann Loveless, [place] sent at 04/28/2018  8:42 AM EST ----- Pap is normal, HPV negative.  Will repeat in 3-5 years.

## 2018-04-28 NOTE — Telephone Encounter (Signed)
Patient advised.

## 2018-05-01 ENCOUNTER — Other Ambulatory Visit: Payer: Self-pay | Admitting: Physician Assistant

## 2018-05-01 ENCOUNTER — Ambulatory Visit
Admission: RE | Admit: 2018-05-01 | Discharge: 2018-05-01 | Disposition: A | Discharge status: Home / Self Care | Payer: 59 | Source: Ambulatory Visit | Attending: Physician Assistant | Admitting: Physician Assistant

## 2018-05-01 DIAGNOSIS — N6459 Other signs and symptoms in breast: Secondary | ICD-10-CM

## 2018-05-01 DIAGNOSIS — R928 Other abnormal and inconclusive findings on diagnostic imaging of breast: Secondary | ICD-10-CM

## 2018-05-02 ENCOUNTER — Other Ambulatory Visit: Payer: Self-pay

## 2018-05-02 LAB — HEMOGLOBIN A1C
Est. average glucose Bld gHb Est-mCnc: 114 mg/dL
Hgb A1c MFr Bld: 5.6 % (ref 4.8–5.6)

## 2018-05-02 LAB — CBC WITH DIFFERENTIAL/PLATELET
Basophils Absolute: 0 10*3/uL (ref 0.0–0.2)
Basos: 1 %
EOS (ABSOLUTE): 0.1 10*3/uL (ref 0.0–0.4)
EOS: 1 %
HEMATOCRIT: 38.6 % (ref 34.0–46.6)
HEMOGLOBIN: 13.1 g/dL (ref 11.1–15.9)
IMMATURE GRANS (ABS): 0 10*3/uL (ref 0.0–0.1)
IMMATURE GRANULOCYTES: 0 %
LYMPHS: 30 %
Lymphocytes Absolute: 2 10*3/uL (ref 0.7–3.1)
MCH: 29.6 pg (ref 26.6–33.0)
MCHC: 33.9 g/dL (ref 31.5–35.7)
MCV: 87 fL (ref 79–97)
MONOCYTES: 6 %
Monocytes Absolute: 0.4 10*3/uL (ref 0.1–0.9)
NEUTROS PCT: 62 %
Neutrophils Absolute: 4.1 10*3/uL (ref 1.4–7.0)
Platelets: 192 10*3/uL (ref 150–450)
RBC: 4.42 x10E6/uL (ref 3.77–5.28)
RDW: 13.1 % (ref 11.7–15.4)
WBC: 6.6 10*3/uL (ref 3.4–10.8)

## 2018-05-02 LAB — COMPREHENSIVE METABOLIC PANEL
ALT: 18 IU/L (ref 0–32)
AST: 14 IU/L (ref 0–40)
Albumin/Globulin Ratio: 1.9 (ref 1.2–2.2)
Albumin: 4.5 g/dL (ref 3.9–5.0)
Alkaline Phosphatase: 66 IU/L (ref 39–117)
BUN/Creatinine Ratio: 15 (ref 9–23)
BUN: 12 mg/dL (ref 6–20)
Bilirubin Total: 0.4 mg/dL (ref 0.0–1.2)
CO2: 21 mmol/L (ref 20–29)
Calcium: 9.3 mg/dL (ref 8.7–10.2)
Chloride: 101 mmol/L (ref 96–106)
Creatinine, Ser: 0.81 mg/dL (ref 0.57–1.00)
GFR calc Af Amer: 113 mL/min/{1.73_m2} (ref >59)
GFR calc non Af Amer: 98 mL/min/{1.73_m2} (ref >59)
Globulin, Total: 2.4 g/dL (ref 1.5–4.5)
Glucose: 87 mg/dL (ref 65–99)
Potassium: 4.2 mmol/L (ref 3.5–5.2)
Sodium: 141 mmol/L (ref 134–144)
Total Protein: 6.9 g/dL (ref 6.0–8.5)

## 2018-05-02 LAB — LIPID PANEL
Chol/HDL Ratio: 5.2 ratio — ABNORMAL HIGH (ref 0.0–4.4)
Cholesterol, Total: 203 mg/dL — ABNORMAL HIGH (ref 100–199)
HDL: 39 mg/dL — AB (ref >39)
LDL Calculated: 129 mg/dL — ABNORMAL HIGH (ref 0–99)
Triglycerides: 177 mg/dL — ABNORMAL HIGH (ref 0–149)
VLDL Cholesterol Cal: 35 mg/dL (ref 5–40)

## 2018-05-02 LAB — TSH: TSH: 2.07 u[IU]/mL (ref 0.450–4.500)

## 2018-05-02 NOTE — Telephone Encounter (Signed)
-----   Message from Margaretann Loveless, [place] sent at 05/02/2018  3:46 PM EST ----- Blood count is normal. Kidney and liver function normal. Sugar normal. Cholesterol borderline high. Work on lifestyle modifications with healthy dieting and exercise. Thyroid is normal as well.

## 2018-05-02 NOTE — Telephone Encounter (Signed)
LMTCB

## 2018-05-02 NOTE — Telephone Encounter (Signed)
-----   Message from Margaretann Loveless, [place] sent at 05/02/2018  3:39 PM EST ----- Most likely benign mammogram and US of the right breast. Will recheck with [place] in 6 months to confirm that area is benign.

## 2018-05-05 MED ORDER — NORETHIN ACE-ETH ESTRAD-FE 1-20 MG-MCG PO TABS: 1.0000 | ORAL_TABLET | Freq: Every day | ORAL | 11 refills | Status: DC

## 2018-05-05 NOTE — Telephone Encounter (Signed)
Patient advised as below. Patient verbalizes understanding and is in agreement with treatment plan.  Patient requesting to change OCP from minipill to Loestrin, pt reports she has taken that in the past.

## 2018-05-05 NOTE — Telephone Encounter (Signed)
lmtcb

## 2018-09-02 ENCOUNTER — Other Ambulatory Visit: Payer: Self-pay

## 2018-09-02 DIAGNOSIS — Z021 Encounter for pre-employment examination: Secondary | ICD-10-CM

## 2018-09-19 ENCOUNTER — Ambulatory Visit (INDEPENDENT_AMBULATORY_CARE_PROVIDER_SITE_OTHER): Payer: 59 | Admitting: Physician Assistant

## 2018-09-19 ENCOUNTER — Encounter: Payer: Self-pay | Admitting: Physician Assistant

## 2018-09-19 DIAGNOSIS — G43001 Migraine without aura, not intractable, with status migrainosus: Secondary | ICD-10-CM | POA: Diagnosis not present

## 2018-09-19 MED ORDER — ONDANSETRON HCL 4 MG PO TABS: 4.0000 mg | ORAL_TABLET | Freq: Three times a day (TID) | ORAL | 0 refills | Status: DC | PRN

## 2018-09-19 MED ORDER — PREDNISONE 20 MG PO TABS: 20.0000 mg | ORAL_TABLET | Freq: Every day | ORAL | 0 refills | Status: DC

## 2018-09-19 NOTE — Patient Instructions (Signed)

## 2018-09-19 NOTE — Progress Notes (Signed)
Virtual Visit via Video Note  I connected with Christie [name] on 09/19/18 at  1:40 PM EDT by a video enabled telemedicine application and verified that I am speaking with the correct person using two identifiers.  Location: Patient: Home Provider: [facility]   I discussed the limitations of evaluation and management by telemedicine and the availability of in person appointments. The patient expressed understanding and agreed to proceed.   Margaretann LovelessJennifer M Burnette, PA-C   Patient: Christie [name] Female    DOB: [birth year]   33 y.o.   MRN: [id number] Visit Date: 09/19/2018  Today's Provider: Margaretann LovelessJennifer M Burnette, PA-C   No chief complaint on file.  Subjective:     Migraine  This is a new problem. The current episode started in the past 7 days (Started Wednesday). The problem occurs intermittently. The problem has been waxing and waning. The pain is located in the right unilateral region. The pain does not radiate. The pain quality is not similar to prior headaches. The quality of the pain is described as pulsating and throbbing. The pain is at a severity of 4/10. The pain is moderate. Associated symptoms include phonophobia and vomiting (once last night). Pertinent negatives include no abdominal pain, back pain, blurred vision, dizziness, ear pain, eye pain, eye redness, eye watering, fever, hearing loss, insomnia, nausea, numbness, photophobia, scalp tenderness, sinus pressure, sore throat, swollen glands, tingling, tinnitus, visual change or weakness. The symptoms are aggravated by activity and emotional stress. She has tried cold packs and NSAIDs for the symptoms. The treatment provided mild relief. There is no history of migraine headaches or migraines in the family.    Allergies  Allergen Reactions  . Bactrim [Sulfamethoxazole-Trimethoprim] Hives    Body hives and lip swelling     Current Outpatient Medications:  .  norethindrone-ethinyl estradiol (LOESTRIN FE 1/20) 1-20 MG-MCG tablet, Take  1 tablet by mouth daily., Disp: 1 Package, Rfl: 11 .  ondansetron (ZOFRAN) 4 MG tablet, Take 1 tablet (4 mg total) by mouth every 8 (eight) hours as needed., Disp: 20 tablet, Rfl: 0 .  predniSONE (DELTASONE) 20 MG tablet, Take 1 tablet (20 mg total) by mouth daily with breakfast., Disp: 5 tablet, Rfl: 0  Review of Systems  Constitutional: Negative for appetite change, chills, fatigue and fever.  HENT: Negative for ear pain, hearing loss, sinus pressure, sore throat and tinnitus.   Eyes: Negative for blurred vision, photophobia, pain and redness.  Respiratory: Negative for chest tightness and shortness of breath.   Cardiovascular: Negative for chest pain and palpitations.  Gastrointestinal: Positive for vomiting (once last night). Negative for abdominal pain and nausea.  Musculoskeletal: Negative for back pain.  Neurological: Negative for dizziness, tingling, weakness and numbness.  Psychiatric/Behavioral: The patient does not have insomnia.     Social History   Tobacco Use  . Smoking status: Never Smoker  . Smokeless tobacco: Never Used  Substance Use Topics  . Alcohol use: No    Alcohol/week: 0.0 standard drinks      Objective:   There were no vitals taken for this visit. There were no vitals filed for this visit.   Physical Exam Vitals signs reviewed.  Constitutional:      General: She is not in acute distress.    Appearance: Normal appearance. She is well-developed and normal weight. She is not ill-appearing.  HENT:     Head: Normocephalic and atraumatic.  Neck:     Musculoskeletal: Normal range of motion and neck supple.  Pulmonary:     Effort: Pulmonary effort is normal. No respiratory distress.  Neurological:     General: No focal deficit present.     Mental Status: She is alert and oriented to person, place, and time. Mental status is at baseline.  Psychiatric:        Mood and Affect: Mood normal.        Behavior: Behavior normal.        Thought Content:  Thought content normal.        Judgment: Judgment normal.      No results found for any visits on 09/19/18.     Assessment & Plan    1. Migraine without aura and with status migrainosus, not intractable First migraine. No family history of migraines. Did have a lot of trigger foods including caffeine, chocolate and a little alcohol with little water. She has been using IBU with mild relief but migraine returning within 4-5 hours of taking IBU. Headache has been persistent x 3 days. Will try to break cycle with prednisone as below. Zofran for associated nausea. She will call or send Mychart message next week if not improving and will consider MRI for new onset persistent headache. - predniSONE (DELTASONE) 20 MG tablet; Take 1 tablet (20 mg total) by mouth daily with breakfast.  Dispense: 5 tablet; Refill: 0 - ondansetron (ZOFRAN) 4 MG tablet; Take 1 tablet (4 mg total) by mouth every 8 (eight) hours as needed.  Dispense: 20 tablet; Refill: 0   I discussed the assessment and treatment plan with the patient. The patient was provided an opportunity to ask questions and all were answered. The patient agreed with the plan and demonstrated an understanding of the instructions.   The patient was advised to call back or seek an in-person evaluation if the symptoms worsen or if the condition fails to improve as anticipated.  I provided 14 minutes of non-face-to-face time during this encounter.   Mar Daring, PA-C  [facility]

## 2018-09-22 ENCOUNTER — Encounter: Payer: Self-pay | Admitting: Physician Assistant

## 2018-09-22 DIAGNOSIS — M62838 Other muscle spasm: Secondary | ICD-10-CM

## 2018-09-22 MED ORDER — CYCLOBENZAPRINE HCL 5 MG PO TABS: 5.0000 mg | ORAL_TABLET | Freq: Three times a day (TID) | ORAL | 1 refills | Status: DC | PRN

## 2018-10-28 ENCOUNTER — Telehealth: Payer: Self-pay | Admitting: Physician Assistant

## 2018-10-28 NOTE — Telephone Encounter (Signed)
FYI:  Pt stating she has spoken with Tawanna Sat regarding the dull headache and light headed feeling in a recent virtual visit.  Pt says the symptoms haven't really gone away and continues to wake up each day with a dull headache.    She made an appt to be seen in person on Wed 8-19 at 3 pm.  If Tawanna Sat is ok with seeing her she will be in the office.  Pt has no other symptoms.  Thanks, [employer]

## 2018-10-28 NOTE — Progress Notes (Signed)
Patient: Christie [name] Female    DOB: 01-26-1986   33 y.o.   MRN: [id number] Visit Date: 10/29/2018  Today's Provider: Mar Daring, PA-C   Chief Complaint  Patient presents with  . Headache   Subjective:     Virtual Visit via Video Note  I connected with Darlen Round on 10/29/18 at  3:00 PM EDT by a video enabled telemedicine application and verified that I am speaking with the correct person using two identifiers.  Location: Patient: Work Provider: [facility]   I discussed the limitations of evaluation and management by telemedicine and the availability of in person appointments. The patient expressed understanding and agreed to proceed.   HPI  Patient c/o consistent dull headache since 09/19/18. Reports that it is changing, reports that 2 weeks ago it was hurting in her neck, then a week ago she was having it at temporal. She is having tenderness to her scalp.  She also c/o after her meals she feels weakness and tingling in her arm off an on, feeling light headed at times. She also has been having a lot of thirst and hunger. She would like her sugar levels to be check. Family history of diabetes.  Allergies  Allergen Reactions  . Bactrim [Sulfamethoxazole-Trimethoprim] Hives    Body hives and lip swelling     Current Outpatient Medications:  .  norethindrone-ethinyl estradiol (LOESTRIN FE 1/20) 1-20 MG-MCG tablet, Take 1 tablet by mouth daily., Disp: 1 Package, Rfl: 11 .  cyclobenzaprine (FLEXERIL) 5 MG tablet, Take 1 tablet (5 mg total) by mouth 3 (three) times daily as needed for muscle spasms. (Patient not taking: Reported on 10/29/2018), Disp: 30 tablet, Rfl: 1 .  ondansetron (ZOFRAN) 4 MG tablet, Take 1 tablet (4 mg total) by mouth every 8 (eight) hours as needed. (Patient not taking: Reported on 10/29/2018), Disp: 20 tablet, Rfl: 0  Review of Systems  Constitutional: Positive for fatigue.  Respiratory: Negative.   Cardiovascular: Negative.   Gastrointestinal:  Negative.   Endocrine: Positive for polydipsia and polyuria. Negative for polyphagia.  Neurological: Positive for weakness, light-headedness, numbness and headaches. Negative for dizziness, tremors, seizures, syncope, facial asymmetry and speech difficulty.  Psychiatric/Behavioral: Negative for sleep disturbance. The patient is nervous/anxious.     Social History   Tobacco Use  . Smoking status: Never Smoker  . Smokeless tobacco: Never Used  Substance Use Topics  . Alcohol use: No    Alcohol/week: 0.0 standard drinks      Objective:   There were no vitals taken for this visit. There were no vitals filed for this visit.   Physical Exam Vitals signs reviewed.  Constitutional:      General: She is not in acute distress.    Appearance: She is well-developed. She is not ill-appearing.  HENT:     Head: Normocephalic and atraumatic.  Neck:     Musculoskeletal: Normal range of motion and neck supple.  Pulmonary:     Effort: Pulmonary effort is normal. No respiratory distress.  Neurological:     Mental Status: She is alert and oriented to person, place, and time. Mental status is at baseline.  Psychiatric:        Mood and Affect: Mood normal.        Speech: Speech normal.        Behavior: Behavior normal.        Thought Content: Thought content normal.        Judgment: Judgment normal.  No results found for any visits on 10/29/18.     Assessment & Plan     1. New daily persistent headache New onset headache sounds multifactorial with stress and muscle tension. Possibly tension headache and migraine overlap. Will check labs as below and f/u pending results. MRI ordered to confirm no mass causing symptoms. Neuro exam limited due to video visit. Advised if all normal and headache persist she needs to be evaluated in person.  - Comprehensive metabolic panel - Hemoglobin A1c - TSH - CBC with Differential/Platelet - ANA,IFA RA Diag Pnl w/rflx Tit/Patn - Sed Rate (ESR) -  C-reactive protein - MR Brain W Wo Contrast; Future - Ambulatory referral to Neurology  2. Polydipsia Will check labs as below and f/u pending results. - Hemoglobin A1c  3. Polyphagia Will check labs as below and f/u pending results. - Hemoglobin A1c   I discussed the assessment and treatment plan with the patient. The patient was provided an opportunity to ask questions and all were answered. The patient agreed with the plan and demonstrated an understanding of the instructions.   The patient was advised to call back or seek an in-person evaluation if the symptoms worsen or if the condition fails to improve as anticipated.  I provided 30 minutes of non-face-to-face time during this encounter.    Mar Daring, PA-C  [facility]

## 2018-10-28 NOTE — Telephone Encounter (Signed)
noted 

## 2018-10-29 ENCOUNTER — Ambulatory Visit (INDEPENDENT_AMBULATORY_CARE_PROVIDER_SITE_OTHER): Payer: 59 | Admitting: Physician Assistant

## 2018-10-29 ENCOUNTER — Encounter: Payer: Self-pay | Admitting: Physician Assistant

## 2018-10-29 DIAGNOSIS — G4452 New daily persistent headache (NDPH): Secondary | ICD-10-CM

## 2018-10-29 DIAGNOSIS — R632 Polyphagia: Secondary | ICD-10-CM

## 2018-10-29 DIAGNOSIS — R631 Polydipsia: Secondary | ICD-10-CM | POA: Diagnosis not present

## 2018-10-29 NOTE — Patient Instructions (Signed)
Tension Headache, Adult A tension headache is pain, pressure, or aching in your head. Tension headaches can last from 30 minutes to several days. Follow these instructions at home: Managing pain  Take over-the-counter and prescription medicines only as told by your doctor.  When you have a headache, lie down in a dark, quiet room.  If told, put ice on your head and neck: ? Put ice in a plastic bag. ? Place a towel between your skin and the bag. ? Leave the ice on for 20 minutes, 2-3 times a day.  If told, put heat on the back of your neck. Do this as often as your doctor tells you to. Use the kind of heat that your doctor recommends, such as a moist heat pack or a heating pad. ? Place a towel between your skin and the heat. ? Leave the heat on for 20-30 minutes. ? Remove the heat if your skin turns bright red. Eating and drinking  Eat meals on a regular schedule.  Watch how much alcohol you drink: ? If you are a woman and are not pregnant, do not drink more than 1 drink a day. ? If you are a man, do not drink more than 2 drinks a day.  Drink enough fluid to keep your pee (urine) pale yellow.  Do not use a lot of caffeine, or stop using caffeine. Lifestyle  Get enough sleep. Get 7-9 hours of sleep each night. Or get the amount of sleep that your doctor tells you to.  At bedtime, remove all electronic devices from your room. Examples of electronic devices are computers, phones, and tablets.  Find ways to lessen your stress. Some things that can lessen stress are: ? Exercise. ? Deep breathing. ? Yoga. ? Music. ? Positive thoughts.  Sit up straight. Do not tighten (tense) your muscles.  Do not use any products that have nicotine or tobacco in them, such as cigarettes and e-cigarettes. If you need help quitting, ask your doctor. General instructions   Keep all follow-up visits as told by your doctor. This is important.  Avoid things that can bring on headaches. Keep a  journal to find out if certain things bring on headaches. For example, write down: ? What you eat and drink. ? How much sleep you get. ? Any change to your diet or medicines. Contact a doctor if:  Your headache does not get better.  Your headache comes back.  You have a headache and sounds, light, or smells bother you.  You feel sick to your stomach (nauseous) or you throw up (vomit).  Your stomach hurts. Get help right away if:  You suddenly get a very bad headache along with any of these: ? A stiff neck. ? Feeling sick to your stomach. ? Throwing up. ? Feeling weak. ? Trouble seeing. ? Feeling short of breath. ? A rash. ? Feeling unusually sleepy. ? Trouble speaking. ? Pain in your eye or ear. ? Trouble walking or balancing. ? Feeling like you will pass out (faint). ? Passing out. Summary  A tension headache is pain, pressure, or aching in your head.  Tension headaches can last from 30 minutes to several days.  Lifestyle changes and medicines may help relieve pain. This information is not intended to replace advice given to you by your health care provider. Make sure you discuss any questions you have with your health care provider. Document Released: 05/23/2009 Document Revised: 02/08/2017 Document Reviewed: 06/08/2016 Elsevier Patient Education  2020 Elsevier   Inc. Form - Headache Record There are many types and causes of headaches. A headache record can help guide your treatment plan. Use this form to record the details. Bring this form with you to your follow-up visits. Follow your health care provider's instructions on how to describe your headache. You may be asked to:  Use a pain scale. This is a tool to rate the intensity of your headache using words or numbers.  Describe what your headache feels like, such as dull, achy, throbbing, or sharp. Headache record Date: _______________ Time (from start to end): ____________________ Location of the headache:  _________________________  Intensity of the headache: ____________________ Description of the headache: ______________________________________________________________  Hours of sleep the night before the headache: __________  Food or drinks before the headache started: ______________________________________________________________________________________  Events before the headache started: _______________________________________________________________________________________________  Symptoms before the headache started: __________________________________________________________________________________________  Symptoms during the headache: __________________________________________________________________________________________________  Treatment: ________________________________________________________________________________________________________________  Effect of treatment: _________________________________________________________________________________________________________  Other comments: ___________________________________________________________________________________________________________ Date: _______________ Time (from start to end): ____________________ Location of the headache: _________________________  Intensity of the headache: ____________________ Description of the headache: ______________________________________________________________  Hours of sleep the night before the headache: __________  Food or drinks before the headache started: ______________________________________________________________________________________  Events before the headache started: ____________________________________________________________________________________________  Symptoms before the headache started: _________________________________________________________________________________________  Symptoms during the headache:  _______________________________________________________________________________________________  Treatment: ________________________________________________________________________________________________________________  Effect of treatment: _________________________________________________________________________________________________________  Other comments: ___________________________________________________________________________________________________________ Date: _______________ Time (from start to end): ____________________ Location of the headache: _________________________  Intensity of the headache: ____________________ Description of the headache: ______________________________________________________________  Hours of sleep the night before the headache: __________  Food or drinks before the headache started: ______________________________________________________________________________________  Events before the headache started: ____________________________________________________________________________________________  Symptoms before the headache started: _________________________________________________________________________________________  Symptoms during the headache: _______________________________________________________________________________________________  Treatment: ________________________________________________________________________________________________________________  Effect of treatment: _________________________________________________________________________________________________________  Other comments: ___________________________________________________________________________________________________________ Date: _______________ Time (from start to end): ____________________ Location of the headache: _________________________  Intensity of the headache: ____________________ Description of the headache:  ______________________________________________________________  Hours of sleep the night before the headache: _________  Food or drinks before the headache started: ______________________________________________________________________________________  Events before the headache started: ____________________________________________________________________________________________  Symptoms before the headache started: _________________________________________________________________________________________  Symptoms during the headache: _______________________________________________________________________________________________  Treatment: ________________________________________________________________________________________________________________  Effect of treatment: _________________________________________________________________________________________________________  Other comments: ___________________________________________________________________________________________________________ Date: _______________ Time (from start to end): ____________________ Location of the headache: _________________________  Intensity of the headache: ____________________ Description of the headache: ______________________________________________________________  Hours of sleep the night before the headache: _________  Food or drinks before the headache started: ______________________________________________________________________________________  Events before the headache started: ____________________________________________________________________________________________  Symptoms before the headache started: _________________________________________________________________________________________  Symptoms during the headache: _______________________________________________________________________________________________  Treatment:  ________________________________________________________________________________________________________________  Effect of treatment: _________________________________________________________________________________________________________  Other comments: ___________________________________________________________________________________________________________ This information is not intended to replace advice given to you by your health care provider. Make sure you discuss any questions you have with your health care provider. Document Released: 03/17/2018 Document Revised: 03/17/2018 Document Reviewed: 03/17/2018 Elsevier Patient Education  [place].

## 2018-10-30 DIAGNOSIS — R632 Polyphagia: Secondary | ICD-10-CM | POA: Diagnosis not present

## 2018-10-30 DIAGNOSIS — G4452 New daily persistent headache (NDPH): Secondary | ICD-10-CM | POA: Diagnosis not present

## 2018-10-30 DIAGNOSIS — R631 Polydipsia: Secondary | ICD-10-CM | POA: Diagnosis not present

## 2018-11-03 ENCOUNTER — Telehealth: Payer: Self-pay

## 2018-11-03 LAB — COMPREHENSIVE METABOLIC PANEL
ALT: 9 IU/L (ref 0–32)
AST: 12 IU/L (ref 0–40)
Albumin/Globulin Ratio: 1.6 (ref 1.2–2.2)
Albumin: 4.4 g/dL (ref 3.8–4.8)
Alkaline Phosphatase: 61 IU/L (ref 39–117)
BUN/Creatinine Ratio: 15 (ref 9–23)
BUN: 13 mg/dL (ref 6–20)
Bilirubin Total: 0.3 mg/dL (ref 0.0–1.2)
CO2: 19 mmol/L — ABNORMAL LOW (ref 20–29)
Calcium: 9.2 mg/dL (ref 8.7–10.2)
Chloride: 105 mmol/L (ref 96–106)
Creatinine, Ser: 0.87 mg/dL (ref 0.57–1.00)
GFR calc Af Amer: 103 mL/min/{1.73_m2} (ref >59)
GFR calc non Af Amer: 89 mL/min/{1.73_m2} (ref >59)
Globulin, Total: 2.7 g/dL (ref 1.5–4.5)
Glucose: 87 mg/dL (ref 65–99)
Potassium: 4.3 mmol/L (ref 3.5–5.2)
Sodium: 140 mmol/L (ref 134–144)
Total Protein: 7.1 g/dL (ref 6.0–8.5)

## 2018-11-03 LAB — CBC WITH DIFFERENTIAL/PLATELET
Basophils Absolute: 0 10*3/uL (ref 0.0–0.2)
Basos: 0 %
EOS (ABSOLUTE): 0 10*3/uL (ref 0.0–0.4)
Eos: 1 %
Hematocrit: 40.9 % (ref 34.0–46.6)
Hemoglobin: 13.3 g/dL (ref 11.1–15.9)
Immature Grans (Abs): 0 10*3/uL (ref 0.0–0.1)
Immature Granulocytes: 0 %
Lymphocytes Absolute: 2.3 10*3/uL (ref 0.7–3.1)
Lymphs: 33 %
MCH: 28.8 pg (ref 26.6–33.0)
MCHC: 32.5 g/dL (ref 31.5–35.7)
MCV: 89 fL (ref 79–97)
Monocytes Absolute: 0.3 10*3/uL (ref 0.1–0.9)
Monocytes: 5 %
Neutrophils Absolute: 4.3 10*3/uL (ref 1.4–7.0)
Neutrophils: 61 %
Platelets: 229 10*3/uL (ref 150–450)
RBC: 4.62 x10E6/uL (ref 3.77–5.28)
RDW: 12.6 % (ref 11.7–15.4)
WBC: 7 10*3/uL (ref 3.4–10.8)

## 2018-11-03 LAB — ANA,IFA RA DIAG PNL W/RFLX TIT/PATN
ANA Titer 1: NEGATIVE
Cyclic Citrullin Peptide Ab: 5 units (ref 0–19)
Rheumatoid fact SerPl-aCnc: <10 IU/mL (ref 0.0–13.9)

## 2018-11-03 LAB — HEMOGLOBIN A1C
Est. average glucose Bld gHb Est-mCnc: 114 mg/dL
Hgb A1c MFr Bld: 5.6 % (ref 4.8–5.6)

## 2018-11-03 LAB — C-REACTIVE PROTEIN: CRP: 8 mg/L (ref 0–10)

## 2018-11-03 LAB — SEDIMENTATION RATE: Sed Rate: 31 mm/hr (ref 0–32)

## 2018-11-03 LAB — TSH: TSH: 3.3 u[IU]/mL (ref 0.450–4.500)

## 2018-11-03 NOTE — Telephone Encounter (Signed)
Patient advised as directed below. 

## 2018-11-03 NOTE — Telephone Encounter (Signed)
-----   Message from Mar Daring, [place] sent at 11/03/2018  1:37 PM EDT ----- All labs are within normal limits and stable.  Thanks! -JB

## 2018-11-05 DIAGNOSIS — H5213 Myopia, bilateral: Secondary | ICD-10-CM | POA: Diagnosis not present

## 2018-11-14 ENCOUNTER — Encounter: Payer: Self-pay | Admitting: Physician Assistant

## 2018-11-14 DIAGNOSIS — Z3041 Encounter for surveillance of contraceptive pills: Secondary | ICD-10-CM

## 2018-11-18 MED ORDER — JUNEL FE 1/20 1-20 MG-MCG PO TABS: 1.0000 | ORAL_TABLET | Freq: Every day | ORAL | 11 refills | Status: DC

## 2018-11-18 NOTE — Addendum Note (Signed)
Addended by: Mar Daring on: 11/18/2018 02:09 PM   Modules accepted: Orders

## 2018-11-24 ENCOUNTER — Other Ambulatory Visit: Payer: Self-pay

## 2018-11-24 ENCOUNTER — Ambulatory Visit
Admission: RE | Admit: 2018-11-24 | Discharge: 2018-11-24 | Disposition: A | Discharge status: Home / Self Care | Payer: 59 | Source: Ambulatory Visit | Attending: Physician Assistant | Admitting: Physician Assistant

## 2018-11-24 DIAGNOSIS — R42 Dizziness and giddiness: Secondary | ICD-10-CM | POA: Diagnosis not present

## 2018-11-24 DIAGNOSIS — G4452 New daily persistent headache (NDPH): Secondary | ICD-10-CM

## 2018-11-24 MED ORDER — GADOBUTROL 1 MMOL/ML IV SOLN
7.0000 mL | Freq: Once | INTRAVENOUS | Status: AC | PRN
  Administered 2018-11-24: 7 mL via INTRAVENOUS

## 2018-12-02 DIAGNOSIS — T1512XA Foreign body in conjunctival sac, left eye, initial encounter: Secondary | ICD-10-CM | POA: Diagnosis not present

## 2018-12-09 ENCOUNTER — Other Ambulatory Visit: Payer: Self-pay | Admitting: Physician Assistant

## 2018-12-09 DIAGNOSIS — Z3041 Encounter for surveillance of contraceptive pills: Secondary | ICD-10-CM

## 2018-12-09 NOTE — Telephone Encounter (Signed)
Pt needing to discuss her JUNEL FE 1/20 1-20 MG-MCG tablet - Pt having migranes while taking the non horomone part of her bc.  Please call pt back and advise at [phone] asap.  Thanks, [employer]

## 2018-12-09 NOTE — Telephone Encounter (Signed)
Pt called back saying she needs a return call asap.  Con Memos

## 2018-12-10 MED ORDER — JUNEL FE 1/20 1-20 MG-MCG PO TABS: 1.0000 | ORAL_TABLET | Freq: Every day | ORAL | 4 refills | Status: DC

## 2018-12-10 NOTE — Telephone Encounter (Signed)
Pt states her gyn suggested to take [facility] continuously and skip the placebo pills.  She needs a new RX sent to [facility].     Thanks,   -Mickel Baas

## 2018-12-15 DIAGNOSIS — G43829 Menstrual migraine, not intractable, without status migrainosus: Secondary | ICD-10-CM | POA: Diagnosis not present

## 2018-12-15 DIAGNOSIS — Z309 Encounter for contraceptive management, unspecified: Secondary | ICD-10-CM | POA: Diagnosis not present

## 2018-12-17 DIAGNOSIS — G4452 New daily persistent headache (NDPH): Secondary | ICD-10-CM | POA: Diagnosis not present

## 2018-12-26 ENCOUNTER — Other Ambulatory Visit: Payer: Self-pay

## 2018-12-26 ENCOUNTER — Ambulatory Visit: Payer: Self-pay

## 2018-12-26 DIAGNOSIS — Z23 Encounter for immunization: Secondary | ICD-10-CM

## 2018-12-30 ENCOUNTER — Telehealth: Payer: Self-pay | Admitting: Physician Assistant

## 2018-12-30 DIAGNOSIS — Z3041 Encounter for surveillance of contraceptive pills: Secondary | ICD-10-CM

## 2018-12-30 NOTE — Telephone Encounter (Signed)
Patient advised as directed below. 

## 2018-12-30 NOTE — Telephone Encounter (Signed)
It was filled for 3 packs at a time with 4 refills and to skip placebo and brand specific on 12/10/18.  Is she not receiving it this way?

## 2018-12-30 NOTE — Telephone Encounter (Signed)
Pt requesting her  JUNEL FE 1/20 1-20 MG-MCG tablet  Be a 3 month supply for a coninuious supply.  This was discussed previously and thought this was the way it was supposed to be written.  Please call pt to let her know this was done at [phone].  Thanks, [employer]

## 2018-12-30 NOTE — Telephone Encounter (Signed)
Please Review

## 2019-03-13 NOTE — L&D Delivery Note (Signed)
Delivery Note  SVD viable female Apgars 9,9 over intact perineum.  Placenta delivered spontaneously intact with 3VC. Good support and hemostasis noted.  Mother and baby to couplet care and are doing well.  EBL 100cc  Candice Camp, MD

## 2019-06-10 DIAGNOSIS — N911 Secondary amenorrhea: Secondary | ICD-10-CM | POA: Diagnosis not present

## 2019-06-15 DIAGNOSIS — Z3685 Encounter for antenatal screening for Streptococcus B: Secondary | ICD-10-CM | POA: Diagnosis not present

## 2019-06-15 DIAGNOSIS — Z3481 Encounter for supervision of other normal pregnancy, first trimester: Secondary | ICD-10-CM | POA: Diagnosis not present

## 2019-06-15 LAB — OB RESULTS CONSOLE RPR: RPR: NONREACTIVE

## 2019-06-15 LAB — OB RESULTS CONSOLE RUBELLA ANTIBODY, IGM: Rubella: IMMUNE

## 2019-06-15 LAB — OB RESULTS CONSOLE HIV ANTIBODY (ROUTINE TESTING): HIV: NONREACTIVE

## 2019-06-15 LAB — OB RESULTS CONSOLE ABO/RH: RH Type: POSITIVE

## 2019-06-15 LAB — OB RESULTS CONSOLE HEPATITIS B SURFACE ANTIGEN: Hepatitis B Surface Ag: NEGATIVE

## 2019-06-15 LAB — OB RESULTS CONSOLE ANTIBODY SCREEN: Antibody Screen: NEGATIVE

## 2019-06-26 DIAGNOSIS — Z331 Pregnant state, incidental: Secondary | ICD-10-CM | POA: Diagnosis not present

## 2019-06-26 DIAGNOSIS — Z124 Encounter for screening for malignant neoplasm of cervix: Secondary | ICD-10-CM | POA: Diagnosis not present

## 2019-06-26 DIAGNOSIS — Z113 Encounter for screening for infections with a predominantly sexual mode of transmission: Secondary | ICD-10-CM | POA: Diagnosis not present

## 2019-06-26 DIAGNOSIS — R69 Illness, unspecified: Secondary | ICD-10-CM | POA: Diagnosis not present

## 2019-06-26 DIAGNOSIS — Z3400 Encounter for supervision of normal first pregnancy, unspecified trimester: Secondary | ICD-10-CM | POA: Diagnosis not present

## 2019-06-29 LAB — OB RESULTS CONSOLE GC/CHLAMYDIA
Chlamydia: NEGATIVE
Gonorrhea: NEGATIVE

## 2019-07-15 DIAGNOSIS — Z36 Encounter for antenatal screening for chromosomal anomalies: Secondary | ICD-10-CM | POA: Diagnosis not present

## 2019-07-15 DIAGNOSIS — Z3A13 13 weeks gestation of pregnancy: Secondary | ICD-10-CM | POA: Diagnosis not present

## 2019-07-15 DIAGNOSIS — Z3682 Encounter for antenatal screening for nuchal translucency: Secondary | ICD-10-CM | POA: Diagnosis not present

## 2019-08-19 ENCOUNTER — Other Ambulatory Visit: Payer: Self-pay

## 2019-08-19 ENCOUNTER — Ambulatory Visit: Payer: Self-pay

## 2019-08-19 VITALS — BP 113/78 | HR 94 | Resp 12

## 2019-08-19 DIAGNOSIS — Z0130 Encounter for examination of blood pressure without abnormal findings: Secondary | ICD-10-CM

## 2019-08-19 NOTE — Progress Notes (Signed)
Presents stating in 2nd trimester of pregnancy (18 weeks) & requesting BP check because the past couple of days she's felt a little faint & dizzy.  Went to a couple of stores to see if she could check it & couldn't find an operable BP monitor.  This is her 2nd pregnancy.  Unsure of her normal BP.  Copy of results given to [place] & she said she will call her OB/GYN with the information & also find out what her BP is when she's at their office.  AMD

## 2019-08-28 DIAGNOSIS — Z3A19 19 weeks gestation of pregnancy: Secondary | ICD-10-CM | POA: Diagnosis not present

## 2019-08-28 DIAGNOSIS — Z363 Encounter for antenatal screening for malformations: Secondary | ICD-10-CM | POA: Diagnosis not present

## 2019-08-28 DIAGNOSIS — Z3400 Encounter for supervision of normal first pregnancy, unspecified trimester: Secondary | ICD-10-CM | POA: Diagnosis not present

## 2019-09-29 DIAGNOSIS — Z362 Encounter for other antenatal screening follow-up: Secondary | ICD-10-CM | POA: Diagnosis not present

## 2019-09-29 DIAGNOSIS — Z3A24 24 weeks gestation of pregnancy: Secondary | ICD-10-CM | POA: Diagnosis not present

## 2019-09-29 DIAGNOSIS — Z3482 Encounter for supervision of other normal pregnancy, second trimester: Secondary | ICD-10-CM | POA: Diagnosis not present

## 2019-10-28 DIAGNOSIS — Z23 Encounter for immunization: Secondary | ICD-10-CM | POA: Diagnosis not present

## 2019-10-28 DIAGNOSIS — Z3480 Encounter for supervision of other normal pregnancy, unspecified trimester: Secondary | ICD-10-CM | POA: Diagnosis not present

## 2019-10-28 DIAGNOSIS — Z3400 Encounter for supervision of normal first pregnancy, unspecified trimester: Secondary | ICD-10-CM | POA: Diagnosis not present

## 2019-10-28 DIAGNOSIS — Z3A28 28 weeks gestation of pregnancy: Secondary | ICD-10-CM | POA: Diagnosis not present

## 2019-11-02 DIAGNOSIS — O99810 Abnormal glucose complicating pregnancy: Secondary | ICD-10-CM | POA: Diagnosis not present

## 2019-11-04 ENCOUNTER — Other Ambulatory Visit: Payer: Self-pay

## 2019-11-04 ENCOUNTER — Other Ambulatory Visit: Payer: 59

## 2019-11-04 DIAGNOSIS — Z20822 Contact with and (suspected) exposure to covid-19: Secondary | ICD-10-CM

## 2019-11-05 LAB — NOVEL CORONAVIRUS, NAA: SARS-CoV-2, NAA: NOT DETECTED

## 2019-11-05 LAB — SARS-COV-2, NAA 2 DAY TAT

## 2019-11-06 ENCOUNTER — Telehealth: Payer: Self-pay | Admitting: *Deleted

## 2019-11-06 DIAGNOSIS — J0140 Acute pansinusitis, unspecified: Secondary | ICD-10-CM

## 2019-11-06 MED ORDER — AMOXICILLIN 500 MG PO CAPS: 500.0000 mg | ORAL_CAPSULE | Freq: Three times a day (TID) | ORAL | 0 refills | Status: DC

## 2019-11-06 NOTE — Telephone Encounter (Signed)
Copied from CRM [id number]. Topic: General - Inquiry >> Nov 06, 2019  9:28 AM Daphine Deutscher D wrote: Reason for CRM: Pt called having sinus and cough , congestion.  She took a covid test and it was neg.  She is [redacted] weeks pregnant.  She wants to know what she can do and if she needs to be seen.  She had a little fever last night.  She has not called her obgyn doctor yet.  CB#  [phone]  Please advise?

## 2019-11-06 NOTE — Telephone Encounter (Signed)
Please advise 

## 2019-11-06 NOTE — Telephone Encounter (Signed)
Patient advised as directed below. 

## 2019-11-06 NOTE — Telephone Encounter (Signed)
Will send in Amoxicillin 500mg  TID for sinus infection.

## 2019-11-24 ENCOUNTER — Other Ambulatory Visit: Payer: 59

## 2019-11-26 DIAGNOSIS — Z3480 Encounter for supervision of other normal pregnancy, unspecified trimester: Secondary | ICD-10-CM | POA: Diagnosis not present

## 2019-12-03 ENCOUNTER — Inpatient Hospital Stay (HOSPITAL_COMMUNITY)
Admission: AD | Admit: 2019-12-03 | Discharge: 2019-12-03 | Disposition: A | Discharge status: Home / Self Care | Payer: 59 | Attending: Obstetrics & Gynecology | Admitting: Obstetrics & Gynecology

## 2019-12-03 ENCOUNTER — Other Ambulatory Visit: Payer: Self-pay

## 2019-12-03 ENCOUNTER — Encounter (HOSPITAL_COMMUNITY): Payer: Self-pay | Admitting: Obstetrics & Gynecology

## 2019-12-03 DIAGNOSIS — Z881 Allergy status to other antibiotic agents status: Secondary | ICD-10-CM | POA: Insufficient documentation

## 2019-12-03 DIAGNOSIS — Z3A33 33 weeks gestation of pregnancy: Secondary | ICD-10-CM

## 2019-12-03 DIAGNOSIS — Z8249 Family history of ischemic heart disease and other diseases of the circulatory system: Secondary | ICD-10-CM | POA: Diagnosis not present

## 2019-12-03 DIAGNOSIS — O133 Gestational [pregnancy-induced] hypertension without significant proteinuria, third trimester: Secondary | ICD-10-CM | POA: Diagnosis not present

## 2019-12-03 DIAGNOSIS — Z792 Long term (current) use of antibiotics: Secondary | ICD-10-CM | POA: Insufficient documentation

## 2019-12-03 DIAGNOSIS — O26893 Other specified pregnancy related conditions, third trimester: Secondary | ICD-10-CM

## 2019-12-03 DIAGNOSIS — R519 Headache, unspecified: Secondary | ICD-10-CM | POA: Diagnosis not present

## 2019-12-03 DIAGNOSIS — Z3689 Encounter for other specified antenatal screening: Secondary | ICD-10-CM

## 2019-12-03 LAB — CBC WITH DIFFERENTIAL/PLATELET
Abs Immature Granulocytes: 0.14 10*3/uL — ABNORMAL HIGH (ref 0.00–0.07)
Basophils Absolute: 0.1 10*3/uL (ref 0.0–0.1)
Basophils Relative: 0 %
Eosinophils Absolute: 0 10*3/uL (ref 0.0–0.5)
Eosinophils Relative: 0 %
HCT: 37.5 % (ref 36.0–46.0)
Hemoglobin: 12.2 g/dL (ref 12.0–15.0)
Immature Granulocytes: 1 %
Lymphocytes Relative: 16 %
Lymphs Abs: 1.8 10*3/uL (ref 0.7–4.0)
MCH: 29 pg (ref 26.0–34.0)
MCHC: 32.5 g/dL (ref 30.0–36.0)
MCV: 89.1 fL (ref 80.0–100.0)
Monocytes Absolute: 0.6 10*3/uL (ref 0.1–1.0)
Monocytes Relative: 5 %
Neutro Abs: 8.7 10*3/uL — ABNORMAL HIGH (ref 1.7–7.7)
Neutrophils Relative %: 78 %
Platelets: 187 10*3/uL (ref 150–400)
RBC: 4.21 MIL/uL (ref 3.87–5.11)
RDW: 12.8 % (ref 11.5–15.5)
WBC: 11.3 10*3/uL — ABNORMAL HIGH (ref 4.0–10.5)
nRBC: 0 % (ref 0.0–0.2)

## 2019-12-03 LAB — COMPREHENSIVE METABOLIC PANEL
ALT: 11 U/L (ref 0–44)
AST: 15 U/L (ref 15–41)
Albumin: 3.1 g/dL — ABNORMAL LOW (ref 3.5–5.0)
Alkaline Phosphatase: 113 U/L (ref 38–126)
Anion gap: 11 (ref 5–15)
BUN: 5 mg/dL — ABNORMAL LOW (ref 6–20)
CO2: 19 mmol/L — ABNORMAL LOW (ref 22–32)
Calcium: 9 mg/dL (ref 8.9–10.3)
Chloride: 106 mmol/L (ref 98–111)
Creatinine, Ser: 0.56 mg/dL (ref 0.44–1.00)
GFR calc Af Amer: >60 mL/min (ref >60)
GFR calc non Af Amer: >60 mL/min (ref >60)
Glucose, Bld: 78 mg/dL (ref 70–99)
Potassium: 4 mmol/L (ref 3.5–5.1)
Sodium: 136 mmol/L (ref 135–145)
Total Bilirubin: 0.2 mg/dL — ABNORMAL LOW (ref 0.3–1.2)
Total Protein: 6.9 g/dL (ref 6.5–8.1)

## 2019-12-03 LAB — URINALYSIS, ROUTINE W REFLEX MICROSCOPIC
Bilirubin Urine: NEGATIVE
Glucose, UA: NEGATIVE mg/dL
Hgb urine dipstick: NEGATIVE
Ketones, ur: NEGATIVE mg/dL
Leukocytes,Ua: NEGATIVE
Nitrite: NEGATIVE
Protein, ur: NEGATIVE mg/dL
Specific Gravity, Urine: 1.01 (ref 1.005–1.030)
pH: 7 (ref 5.0–8.0)

## 2019-12-03 LAB — PROTEIN / CREATININE RATIO, URINE
Creatinine, Urine: 39.3 mg/dL
Total Protein, Urine: <6 mg/dL

## 2019-12-03 MED ORDER — CYCLOBENZAPRINE HCL 10 MG PO TABS: 10.0000 mg | ORAL_TABLET | Freq: Three times a day (TID) | ORAL | 0 refills | Status: DC | PRN

## 2019-12-03 MED ORDER — CYCLOBENZAPRINE HCL 5 MG PO TABS
10.0000 mg | ORAL_TABLET | Freq: Once | ORAL | Status: AC
  Administered 2019-12-03: 10 mg via ORAL
  Filled 2019-12-03: qty 2

## 2019-12-03 MED ORDER — ACETAMINOPHEN 500 MG PO TABS
1000.0000 mg | ORAL_TABLET | Freq: Once | ORAL | Status: AC
  Administered 2019-12-03: 1000 mg via ORAL
  Filled 2019-12-03: qty 2

## 2019-12-03 NOTE — MAU Provider Note (Signed)
. History     CSN: [id number]  Arrival date and time: 12/03/19 1133   First Provider Initiated Contact with Patient 12/03/19 1218      Chief Complaint  Patient presents with  . BP Eval   Ms. Christie [name] is a 34 y.o. G2P1001 at [redacted]w[redacted]d who presents to MAU for preeclampsia evaluation after she was seen in the office today for follow-up BP check. Patient reports she was in the office last Thursday for a regular visit and her BP was mildly elevated, so she was scheduled today for another visit and was found to have a BP of 150/100 in the office.  Patient also endorses slight headache that started a few days ago and is very mild and reports it is across her entire head and down the back of her neck and upper shoulders. Patient reports she took Tylenol at 2AM this morning, which helped some as she was awoken by the pain and was then able to go back to sleep. Patient rates HA as 2/10, neck pain 4/10.  Pt denies blurry vision/seeing spots, N/V, epigastric pain, swelling in face and hands, sudden weight gain. Pt denies chest pain and SOB.  Pt denies constipation, diarrhea, or urinary problems. Pt denies fever, chills, fatigue, sweating or changes in appetite. Pt denies dizziness, light-headedness, weakness.  Pt denies VB, ctx, LOF and reports good FM.  Current pregnancy problems? gHTN  Blood Type? O Positive Allergies? Bactrim Current medications? PNVs Current [facility] & next appt? [facility], 12/09/2019   OB History    Gravida  2   Para  1   Term  1   Preterm      AB      Living  1     SAB      TAB      Ectopic      Multiple  0   Live Births  1           Past Medical History:  Diagnosis Date  . GERD (gastroesophageal reflux disease)     Past Surgical History:  Procedure Laterality Date  . NO PAST SURGERIES    . WISDOM TOOTH EXTRACTION      Family History  Problem Relation Age of Onset  . Melanoma Mother   . Thyroid disease Mother   .  Hypertension Mother   . Breast cancer Neg Hx     Social History   Tobacco Use  . Smoking status: Never Smoker  . Smokeless tobacco: Never Used  Substance Use Topics  . Alcohol use: No    Alcohol/week: 0.0 standard drinks  . Drug use: No    Allergies:  Allergies  Allergen Reactions  . Bactrim [Sulfamethoxazole-Trimethoprim] Hives    Body hives and lip swelling    Medications Prior to Admission  Medication Sig Dispense Refill Last Dose  . Prenatal Vit-Fe Fumarate-FA (PRENATAL MULTIVITAMIN) TABS tablet Take 1 tablet by mouth daily at 12 noon.     Marland Kitchen amoxicillin (AMOXIL) 500 MG capsule Take 1 capsule (500 mg total) by mouth 3 (three) times daily. 30 capsule 0   . cyclobenzaprine (FLEXERIL) 5 MG tablet Take 1 tablet (5 mg total) by mouth 3 (three) times daily as needed for muscle spasms. (Patient not taking: Reported on 10/29/2018) 30 tablet 1   . JUNEL FE 1/20 1-20 MG-MCG tablet Take 1 tablet by mouth daily. Skip Placebo pills. Brand only please 3 Package 4   . ondansetron (ZOFRAN) 4 MG tablet Take 1  tablet (4 mg total) by mouth every 8 (eight) hours as needed. (Patient not taking: Reported on 10/29/2018) 20 tablet 0     Review of Systems  Constitutional: Negative for chills, diaphoresis, fatigue and fever.  Eyes: Negative for visual disturbance.  Respiratory: Negative for shortness of breath.   Cardiovascular: Negative for chest pain.  Gastrointestinal: Negative for abdominal pain, constipation, diarrhea, nausea and vomiting.  Genitourinary: Negative for dysuria, flank pain, frequency, pelvic pain, urgency, vaginal bleeding and vaginal discharge.  Musculoskeletal: Positive for neck pain.  Neurological: Positive for headaches. Negative for dizziness, weakness and light-headedness.   Physical Exam   Blood pressure 124/79, pulse 71, temperature 98.7 F (37.1 C), temperature source Oral, resp. rate 18, height 5\' 4"  (1.626 m), weight 81.8 kg, SpO2 97 %, unknown if currently  breastfeeding.  Patient Vitals for the past 24 hrs:  BP Temp Temp src Pulse Resp SpO2 Height Weight  12/03/19 1400 124/79 -- -- 71 -- -- -- --  12/03/19 1345 116/80 -- -- 79 -- -- -- --  12/03/19 1315 133/90 -- -- 84 -- -- -- --  12/03/19 1300 (!) 137/94 -- -- 91 -- -- -- --  12/03/19 1245 130/87 -- -- 87 -- -- -- --  12/03/19 1230 (!) 147/100 -- -- 96 -- -- -- --  12/03/19 1215 (!) 137/93 -- -- (!) 102 -- 97 % -- --  12/03/19 1207 (!) 143/88 -- -- (!) 106 -- -- -- --  12/03/19 1152 139/89 98.7 F (37.1 C) Oral 97 18 99 % -- --  12/03/19 1148 -- -- -- -- -- -- 5\' 4"  (1.626 m) 81.8 kg   Physical Exam Vitals and nursing note reviewed.  Constitutional:      General: She is not in acute distress.    Appearance: Normal appearance. She is normal weight. She is not ill-appearing, toxic-appearing or diaphoretic.  HENT:     Head: Normocephalic and atraumatic.  Pulmonary:     Effort: Pulmonary effort is normal.  Neurological:     Mental Status: She is alert and oriented to person, place, and time.  Psychiatric:        Mood and Affect: Mood normal.        Behavior: Behavior normal.        Thought Content: Thought content normal.        Judgment: Judgment normal.    Results for orders placed or performed during the hospital encounter of 12/03/19 (from the past 24 hour(s))  CBC with Differential/Platelet     Status: Abnormal   Collection Time: 12/03/19 12:16 PM  Result Value Ref Range   WBC 11.3 (H) 4.0 - 10.5 K/uL   RBC 4.21 3.87 - 5.11 MIL/uL   Hemoglobin 12.2 12.0 - 15.0 g/dL   HCT 47.837.5 36 - 46 %   MCV 89.1 80.0 - 100.0 fL   MCH 29.0 26.0 - 34.0 pg   MCHC 32.5 30.0 - 36.0 g/dL   RDW 29.512.8 62.111.5 - 30.815.5 %   Platelets 187 150 - 400 K/uL   nRBC 0.0 0.0 - 0.2 %   Neutrophils Relative % 78 %   Neutro Abs 8.7 (H) 1.7 - 7.7 K/uL   Lymphocytes Relative 16 %   Lymphs Abs 1.8 0.7 - 4.0 K/uL   Monocytes Relative 5 %   Monocytes Absolute 0.6 0 - 1 K/uL   Eosinophils Relative 0 %    Eosinophils Absolute 0.0 0 - 0 K/uL   Basophils Relative 0 %  Basophils Absolute 0.1 0 - 0 K/uL   Immature Granulocytes 1 %   Abs Immature Granulocytes 0.14 (H) 0.00 - 0.07 K/uL  Comprehensive metabolic panel     Status: Abnormal   Collection Time: 12/03/19 12:16 PM  Result Value Ref Range   Sodium 136 135 - 145 mmol/L   Potassium 4.0 3.5 - 5.1 mmol/L   Chloride 106 98 - 111 mmol/L   CO2 19 (L) 22 - 32 mmol/L   Glucose, Bld 78 70 - 99 mg/dL   BUN 5 (L) 6 - 20 mg/dL   Creatinine, Ser 3.82 0.44 - 1.00 mg/dL   Calcium 9.0 8.9 - 50.5 mg/dL   Total Protein 6.9 6.5 - 8.1 g/dL   Albumin 3.1 (L) 3.5 - 5.0 g/dL   AST 15 15 - 41 U/L   ALT 11 0 - 44 U/L   Alkaline Phosphatase 113 38 - 126 U/L   Total Bilirubin 0.2 (L) 0.3 - 1.2 mg/dL   GFR calc non Af Amer >60 >60 mL/min   GFR calc Af Amer >60 >60 mL/min   Anion gap 11 5 - 15  Urinalysis, Routine w reflex microscopic Urine, Clean Catch     Status: None   Collection Time: 12/03/19 12:32 PM  Result Value Ref Range   Color, Urine YELLOW YELLOW   APPearance CLEAR CLEAR   Specific Gravity, Urine 1.010 1.005 - 1.030   pH 7.0 5.0 - 8.0   Glucose, UA NEGATIVE NEGATIVE mg/dL   Hgb urine dipstick NEGATIVE NEGATIVE   Bilirubin Urine NEGATIVE NEGATIVE   Ketones, ur NEGATIVE NEGATIVE mg/dL   Protein, ur NEGATIVE NEGATIVE mg/dL   Nitrite NEGATIVE NEGATIVE   Leukocytes,Ua NEGATIVE NEGATIVE  Protein / creatinine ratio, urine     Status: None   Collection Time: 12/03/19 12:32 PM  Result Value Ref Range   Creatinine, Urine 39.30 mg/dL   Total Protein, Urine <6 mg/dL   Protein Creatinine Ratio        0.00 - 0.15 mg/mg[Cre]   No results found.  MAU Course  Procedures  MDM -preeclampsia evaluation without severe range BP in MAU on admission -elevated BP of 140s/90s -symptoms include: HA, neck pain; Flexeril/Tylenol given -UA: WNL -CBC: H/H 12.2/37.5, platelets 187 -CMP: serum creatinine 0.56, AST/ALT 15/11 -PCr: below reportable  range -EFM: reactive       -baseline: 140       -variability: moderate       -accels: present, 15x15       -decels: absent       -TOCO: irritability -after medication administration, pt reports HA/neck pain now resolved -pt with diagnosis of gHTN -pt discharged to home in stable condition  Orders Placed This Encounter  Procedures  . Urinalysis, Routine w reflex microscopic Urine, Clean Catch    Standing Status:   Standing    Number of Occurrences:   1  . CBC with Differential/Platelet    Standing Status:   Standing    Number of Occurrences:   1  . Comprehensive metabolic panel    Standing Status:   Standing    Number of Occurrences:   1  . Protein / creatinine ratio, urine    Standing Status:   Standing    Number of Occurrences:   1  . Discharge patient    Order Specific Question:   Discharge disposition    Answer:   01-Home or Self Care [1]    Order Specific Question:   Discharge patient date  Answer:   12/03/2019   Meds ordered this encounter  Medications  . cyclobenzaprine (FLEXERIL) tablet 10 mg  . acetaminophen (TYLENOL) tablet 1,000 mg  . cyclobenzaprine (FLEXERIL) 10 MG tablet    Sig: Take 1 tablet (10 mg total) by mouth 3 (three) times daily as needed.    Dispense:  30 tablet    Refill:  0    Order Specific Question:   Supervising Provider    Answer:   Conan Bowens [[id number]]    Assessment and Plan   1. Gestational hypertension, third trimester   2. [redacted] weeks gestation of pregnancy   3. Pregnancy headache in third trimester   4. NST (non-stress test) reactive      Allergies as of 12/03/2019      Reactions   Bactrim [sulfamethoxazole-trimethoprim] Hives   Body hives and lip swelling      Medication List    STOP taking these medications   Junel FE 1/20 1-20 MG-MCG tablet Generic drug: norethindrone-ethinyl estradiol     TAKE these medications   amoxicillin 500 MG capsule Commonly known as: AMOXIL Take 1 capsule (500 mg total) by mouth 3  (three) times daily.   cyclobenzaprine 10 MG tablet Commonly known as: FLEXERIL Take 1 tablet (10 mg total) by mouth 3 (three) times daily as needed. What changed:   medication strength  how much to take  reasons to take this   ondansetron 4 MG tablet Commonly known as: ZOFRAN Take 1 tablet (4 mg total) by mouth every 8 (eight) hours as needed.   prenatal multivitamin Tabs tablet Take 1 tablet by mouth daily at 12 noon.      -Reviewed warning blood pressure values (systolic = / > 140 and/or diastolic =/> 90). Explained that, if blood pressure is elevated, she should sit down, rest, and eat/drink something. If still elevated 15 minutes later, and she is greater than 20 weeks, she should call clinic or come to MAU. She should come to MAU if she has elevated pressures and any of the following:  - headache not relieved with tylenol, rest, hydration -blurry vision, floating spots in her vision - sudden full-body edema or facial edema -RUQ pain that is constant. These symptoms may indicate that her blood pressure is worsening and she may be developing gestational hypertension or pre-eclampsia, which is an emergency. -return MAU precautions given -pt discharged to home in stable condition   Odie Sera Deandrae Wajda 12/03/2019, 2:48 PM

## 2019-12-03 NOTE — MAU Note (Signed)
Sent from MD office for BP evaluation.  Denies visual disturbances, endorses mild H/A and epigastric pain.  Endorses +FM.  Denies VB or LOF.

## 2019-12-03 NOTE — Discharge Instructions (Signed)
Hypertension During Pregnancy High blood pressure (hypertension) is when the force of blood pumping through the arteries is too strong. Arteries are blood vessels that carry blood from the heart throughout the body. Hypertension during pregnancy can be mild or severe. Severe hypertension during pregnancy (preeclampsia) is a medical emergency that requires prompt evaluation and treatment. Different types of hypertension can happen during pregnancy. These include:  Chronic hypertension. This happens when you had high blood pressure before you became pregnant, and it continues during the pregnancy. Hypertension that develops before you are [redacted] weeks pregnant and continues during the pregnancy is also called chronic hypertension. If you have chronic hypertension, it will not go away after you have your baby. You will need follow-up visits with your health care provider after you have your baby. Your doctor may want you to keep taking medicine for your blood pressure.  Gestational hypertension. This is hypertension that develops after the 20th week of pregnancy. Gestational hypertension usually goes away after you have your baby, but your health care provider will need to monitor your blood pressure to make sure that it is getting better.  Preeclampsia. This is severe hypertension during pregnancy. This can cause serious complications for you and your baby and can also cause complications for you after the delivery of your baby.  Postpartum preeclampsia. You may develop severe hypertension after giving birth. This usually occurs within 48 hours after childbirth but may occur up to 6 weeks after giving birth. This is rare. How does this affect me? Women who have hypertension during pregnancy have a greater chance of developing hypertension later in life or during future pregnancies. In some cases, hypertension during pregnancy can cause serious complications, such as:  Stroke.  Heart attack.  Injury to  other organs, such as kidneys, lungs, or liver.  Preeclampsia.  Convulsions or seizures.  Placental abruption. How does this affect my baby? Hypertension during pregnancy can affect your baby. Your baby may:  Be born early (prematurely).  Not weigh as much as he or she should at birth (low birth weight).  Not tolerate labor well, leading to an unplanned cesarean delivery. What are the risks? There are certain factors that make it more likely for you to develop hypertension during pregnancy. These include:  Having hypertension during a previous pregnancy.  Being overweight.  Being age 35 or older.  Being pregnant for the first time.  Being pregnant with more than one baby.  Becoming pregnant using fertilization methods, such as IVF (in vitro fertilization).  Having other medical problems, such as diabetes, kidney disease, or lupus.  Having a family history of hypertension. What can I do to lower my risk? The exact cause of hypertension during pregnancy is not known. You may be able to lower your risk by:  Maintaining a healthy weight.  Eating a healthy and balanced diet.  Following your health care provider's instructions about treating any long-term conditions that you had before becoming pregnant. It is very important to keep all of your prenatal care appointments. Your health care provider will check your blood pressure and make sure that your pregnancy is progressing as expected. If a problem is found, early treatment can prevent complications. How is this treated? Treatment for hypertension during pregnancy varies depending on the type of hypertension you have and how serious it is.  If you were taking medicine for high blood pressure before you became pregnant, talk with your health care provider. You may need to change medicine during pregnancy because   some medicines, like ACE inhibitors, may not be considered safe for your baby.  If you have gestational  hypertension, your health care provider may order medicine to treat this during pregnancy.  If you are at risk for preeclampsia, your health care provider may recommend that you take a low-dose aspirin during your pregnancy.  If you have severe hypertension, you may need to be hospitalized so you and your baby can be monitored closely. You may also need to be given medicine to lower your blood pressure. This medicine may be given by mouth or through an IV.  In some cases, if your condition gets worse, you may need to deliver your baby early. Follow these instructions at home: Eating and drinking   Drink enough fluid to keep your urine pale yellow.  Avoid caffeine. Lifestyle  Do not use any products that contain nicotine or tobacco, such as cigarettes, e-cigarettes, and chewing tobacco. If you need help quitting, ask your health care provider.  Do not use alcohol or drugs.  Avoid stress as much as possible.  Rest and get plenty of sleep.  Regular exercise can help to reduce your blood pressure. Ask your health care provider what kinds of exercise are best for you. General instructions  Take over-the-counter and prescription medicines only as told by your health care provider.  Keep all prenatal and follow-up visits as told by your health care provider. This is important. Contact a health care provider if:  You have symptoms that your health care provider told you may require more treatment or monitoring, such as: ? Headaches. ? Nausea or vomiting. ? Abdominal pain. ? Dizziness. ? Light-headedness. Get help right away if:  You have: ? Severe abdominal pain that does not get better with treatment. ? A severe headache that does not get better. ? Vomiting that does not get better. ? Sudden, rapid weight gain. ? Sudden swelling in your hands, ankles, or face. ? Vaginal bleeding. ? Blood in your urine. ? Blurred or double vision. ? Shortness of breath or chest  pain. ? Weakness on one side of your body. ? Difficulty speaking.  Your baby is not moving as much as usual. Summary  High blood pressure (hypertension) is when the force of blood pumping through the arteries is too strong.  Hypertension during pregnancy can cause problems for you and your baby.  Treatment for hypertension during pregnancy varies depending on the type of hypertension you have and how serious it is.  Keep all prenatal and follow-up visits as told by your health care provider. This is important. This information is not intended to replace advice given to you by your health care provider. Make sure you discuss any questions you have with your health care provider. Document Revised: 06/19/2018 Document Reviewed: 03/25/2018 Elsevier Patient Education  [place].        Preeclampsia and Eclampsia Preeclampsia is a serious condition that may develop during pregnancy. This condition causes high blood pressure and increased protein in your urine along with other symptoms, such as headaches and vision changes. These symptoms may develop as the condition gets worse. Preeclampsia may occur at 20 weeks of pregnancy or later. Diagnosing and treating preeclampsia early is very important. If not treated early, it can cause serious problems for you and your baby. One problem it can lead to is eclampsia. Eclampsia is a condition that causes muscle jerking or shaking (convulsions or seizures) and other serious problems for the mother. During pregnancy, delivering your baby  may be the best treatment for preeclampsia or eclampsia. For most women, preeclampsia and eclampsia symptoms go away after giving birth. In rare cases, a woman may develop preeclampsia after giving birth (postpartum preeclampsia). This usually occurs within 48 hours after childbirth but may occur up to 6 weeks after giving birth. What are the causes? The cause of preeclampsia is not known. What increases the  risk? The following risk factors make you more likely to develop preeclampsia:  Being pregnant for the first time.  Having had preeclampsia during a past pregnancy.  Having a family history of preeclampsia.  Having high blood pressure.  Being pregnant with more than one baby.  Being 63 or older.  Being African-American.  Having kidney disease or diabetes.  Having medical conditions such as lupus or blood diseases.  Being very overweight (obese). What are the signs or symptoms? The most common symptoms are:  Severe headaches.  Vision problems, such as blurred or double vision.  Abdominal pain, especially upper abdominal pain. Other symptoms that may develop as the condition gets worse include:  Sudden weight gain.  Sudden swelling of the hands, face, legs, and feet.  Severe nausea and vomiting.  Numbness in the face, arms, legs, and feet.  Dizziness.  Urinating less than usual.  Slurred speech.  Convulsions or seizures. How is this diagnosed? There are no screening tests for preeclampsia. Your health care provider will ask you about symptoms and check for signs of preeclampsia during your prenatal visits. You may also have tests that include:  Checking your blood pressure.  Urine tests to check for protein. Your health care provider will check for this at every prenatal visit.  Blood tests.  Monitoring your baby's heart rate.  Ultrasound. How is this treated? You and your health care provider will determine the treatment approach that is best for you. Treatment may include:  Having more frequent prenatal exams to check for signs of preeclampsia, if you have an increased risk for preeclampsia.  Medicine to lower your blood pressure.  Staying in the hospital, if your condition is severe. There, treatment will focus on controlling your blood pressure and the amount of fluids in your body (fluid retention).  Taking medicine (magnesium sulfate) to prevent  seizures. This may be given as an injection or through an IV.  Taking a low-dose aspirin during your pregnancy.  Delivering your baby early. You may have your labor started with medicine (induced), or you may have a cesarean delivery. Follow these instructions at home: Eating and drinking   Drink enough fluid to keep your urine pale yellow.  Avoid caffeine. Lifestyle  Do not use any products that contain nicotine or tobacco, such as cigarettes and e-cigarettes. If you need help quitting, ask your health care provider.  Do not use alcohol or drugs.  Avoid stress as much as possible. Rest and get plenty of sleep. General instructions  Take over-the-counter and prescription medicines only as told by your health care provider.  When lying down, lie on your left side. This keeps pressure off your major blood vessels.  When sitting or lying down, raise (elevate) your feet. Try putting some pillows underneath your lower legs.  Exercise regularly. Ask your health care provider what kinds of exercise are best for you.  Keep all follow-up and prenatal visits as told by your health care provider. This is important. How is this prevented? There is no known way of preventing preeclampsia or eclampsia from developing. However, to lower your risk  of complications and detect problems early:  Get regular prenatal care. Your health care provider may be able to diagnose and treat the condition early.  Maintain a healthy weight. Ask your health care provider for help managing weight gain during pregnancy.  Work with your health care provider to manage any long-term (chronic) health conditions you have, such as diabetes or kidney problems.  You may have tests of your blood pressure and kidney function after giving birth.  Your health care provider may have you take low-dose aspirin during your next pregnancy. Contact a health care provider if:  You have symptoms that your health care provider  told you may require more treatment or monitoring, such as: ? Headaches. ? Nausea or vomiting. ? Abdominal pain. ? Dizziness. ? Light-headedness. Get help right away if:  You have severe: ? Abdominal pain. ? Headaches that do not get better. ? Dizziness. ? Vision problems. ? Confusion. ? Nausea or vomiting.  You have any of the following: ? A seizure. ? Sudden, rapid weight gain. ? Sudden swelling in your hands, ankles, or face. ? Trouble moving any part of your body. ? Numbness in any part of your body. ? Trouble speaking. ? Abnormal bleeding.  You faint. Summary  Preeclampsia is a serious condition that may develop during pregnancy.  This condition causes high blood pressure and increased protein in your urine along with other symptoms, such as headaches and vision changes.  Diagnosing and treating preeclampsia early is very important. If not treated early, it can cause serious problems for you and your baby.  Get help right away if you have symptoms that your health care provider told you to watch for. This information is not intended to replace advice given to you by your health care provider. Make sure you discuss any questions you have with your health care provider. Document Revised: 10/29/2017 Document Reviewed: 10/03/2015 Elsevier Patient Education  2020 [employer].        Preterm Labor and Birth Information  The normal length of a pregnancy is 39-41 weeks. Preterm labor is when labor starts before 37 completed weeks of pregnancy. What are the risk factors for preterm labor? Preterm labor is more likely to occur in women who:  Have certain infections during pregnancy such as a bladder infection, sexually transmitted infection, or infection inside the uterus (chorioamnionitis).  Have a shorter-than-normal cervix.  Have gone into preterm labor before.  Have had surgery on their cervix.  Are younger than age 60 or older than age 51.  Are African  American.  Are pregnant with twins or multiple babies (multiple gestation).  Take street drugs or smoke while pregnant.  Do not gain enough weight while pregnant.  Became pregnant shortly after having been pregnant. What are the symptoms of preterm labor? Symptoms of preterm labor include:  Cramps similar to those that can happen during a menstrual period. The cramps may happen with diarrhea.  Pain in the abdomen or lower back.  Regular uterine contractions that may feel like tightening of the abdomen.  A feeling of increased pressure in the pelvis.  Increased watery or bloody mucus discharge from the vagina.  Water breaking (ruptured amniotic sac). Why is it important to recognize signs of preterm labor? It is important to recognize signs of preterm labor because babies who are born prematurely may not be fully developed. This can put them at an increased risk for:  Long-term (chronic) heart and lung problems.  Difficulty immediately after birth with regulating  body systems, including blood sugar, body temperature, heart rate, and breathing rate.  Bleeding in the brain.  Cerebral palsy.  Learning difficulties.  Death. These risks are highest for babies who are born before 34 weeks of pregnancy. How is preterm labor treated? Treatment depends on the length of your pregnancy, your condition, and the health of your baby. It may involve:  Having a stitch (suture) placed in your cervix to prevent your cervix from opening too early (cerclage).  Taking or being given medicines, such as: ? Hormone medicines. These may be given early in pregnancy to help support the pregnancy. ? Medicine to stop contractions. ? Medicines to help mature the baby's lungs. These may be prescribed if the risk of delivery is high. ? Medicines to prevent your baby from developing cerebral palsy. If the labor happens before 34 weeks of pregnancy, you may need to stay in the hospital. What should I  do if I think I am in preterm labor? If you think that you are going into preterm labor, call your health care provider right away. How can I prevent preterm labor in future pregnancies? To increase your chance of having a full-term pregnancy:  Do not use any tobacco products, such as cigarettes, chewing tobacco, and e-cigarettes. If you need help quitting, ask your health care provider.  Do not use street drugs or medicines that have not been prescribed to you during your pregnancy.  Talk with your health care provider before taking any herbal supplements, even if you have been taking them regularly.  Make sure you gain a healthy amount of weight during your pregnancy.  Watch for infection. If you think that you might have an infection, get it checked right away.  Make sure to tell your health care provider if you have gone into preterm labor before. This information is not intended to replace advice given to you by your health care provider. Make sure you discuss any questions you have with your health care provider. Document Revised: 06/20/2018 Document Reviewed: 07/20/2015 Elsevier Patient Education  2020 [employer].

## 2019-12-18 DIAGNOSIS — Z3400 Encounter for supervision of normal first pregnancy, unspecified trimester: Secondary | ICD-10-CM | POA: Diagnosis not present

## 2019-12-18 DIAGNOSIS — Z3A35 35 weeks gestation of pregnancy: Secondary | ICD-10-CM | POA: Diagnosis not present

## 2019-12-18 DIAGNOSIS — O133 Gestational [pregnancy-induced] hypertension without significant proteinuria, third trimester: Secondary | ICD-10-CM | POA: Diagnosis not present

## 2019-12-18 DIAGNOSIS — Z3685 Encounter for antenatal screening for Streptococcus B: Secondary | ICD-10-CM | POA: Diagnosis not present

## 2019-12-18 LAB — OB RESULTS CONSOLE GBS: GBS: NEGATIVE

## 2019-12-22 DIAGNOSIS — Z3400 Encounter for supervision of normal first pregnancy, unspecified trimester: Secondary | ICD-10-CM | POA: Diagnosis not present

## 2019-12-22 DIAGNOSIS — I10 Essential (primary) hypertension: Secondary | ICD-10-CM | POA: Diagnosis not present

## 2019-12-22 DIAGNOSIS — O4103X0 Oligohydramnios, third trimester, not applicable or unspecified: Secondary | ICD-10-CM | POA: Diagnosis not present

## 2019-12-22 DIAGNOSIS — Z3A36 36 weeks gestation of pregnancy: Secondary | ICD-10-CM | POA: Diagnosis not present

## 2019-12-28 DIAGNOSIS — O139 Gestational [pregnancy-induced] hypertension without significant proteinuria, unspecified trimester: Secondary | ICD-10-CM | POA: Diagnosis not present

## 2019-12-28 DIAGNOSIS — O365930 Maternal care for other known or suspected poor fetal growth, third trimester, not applicable or unspecified: Secondary | ICD-10-CM | POA: Diagnosis not present

## 2019-12-28 DIAGNOSIS — Z3483 Encounter for supervision of other normal pregnancy, third trimester: Secondary | ICD-10-CM | POA: Diagnosis not present

## 2019-12-28 DIAGNOSIS — Z3A36 36 weeks gestation of pregnancy: Secondary | ICD-10-CM | POA: Diagnosis not present

## 2019-12-29 ENCOUNTER — Telehealth (HOSPITAL_COMMUNITY): Payer: Self-pay | Admitting: *Deleted

## 2019-12-29 ENCOUNTER — Encounter (HOSPITAL_COMMUNITY): Payer: Self-pay | Admitting: *Deleted

## 2019-12-29 NOTE — Telephone Encounter (Signed)
Preadmission screen  

## 2020-01-02 ENCOUNTER — Other Ambulatory Visit (HOSPITAL_COMMUNITY)
Admission: RE | Admit: 2020-01-02 | Discharge: 2020-01-02 | Disposition: A | Discharge status: Home / Self Care | Payer: 59 | Source: Ambulatory Visit | Attending: Obstetrics and Gynecology | Admitting: Obstetrics and Gynecology

## 2020-01-02 DIAGNOSIS — Z20822 Contact with and (suspected) exposure to covid-19: Secondary | ICD-10-CM | POA: Insufficient documentation

## 2020-01-02 DIAGNOSIS — Z01812 Encounter for preprocedural laboratory examination: Secondary | ICD-10-CM | POA: Insufficient documentation

## 2020-01-03 LAB — SARS CORONAVIRUS 2 (TAT 6-24 HRS): SARS Coronavirus 2: NEGATIVE

## 2020-01-04 ENCOUNTER — Inpatient Hospital Stay (HOSPITAL_COMMUNITY): Payer: 59 | Admitting: Anesthesiology

## 2020-01-04 ENCOUNTER — Inpatient Hospital Stay (HOSPITAL_COMMUNITY)
Admission: AD | Admit: 2020-01-04 | Discharge: 2020-01-06 | DRG: 807 | Disposition: A | Discharge status: Home / Self Care | Payer: 59 | Attending: Obstetrics and Gynecology | Admitting: Obstetrics and Gynecology

## 2020-01-04 ENCOUNTER — Other Ambulatory Visit: Payer: Self-pay

## 2020-01-04 ENCOUNTER — Inpatient Hospital Stay (HOSPITAL_COMMUNITY): Payer: 59

## 2020-01-04 ENCOUNTER — Encounter (HOSPITAL_COMMUNITY): Payer: Self-pay | Admitting: Obstetrics and Gynecology

## 2020-01-04 DIAGNOSIS — Z3A38 38 weeks gestation of pregnancy: Secondary | ICD-10-CM

## 2020-01-04 DIAGNOSIS — Z20822 Contact with and (suspected) exposure to covid-19: Secondary | ICD-10-CM | POA: Diagnosis not present

## 2020-01-04 DIAGNOSIS — O9962 Diseases of the digestive system complicating childbirth: Secondary | ICD-10-CM | POA: Diagnosis not present

## 2020-01-04 DIAGNOSIS — O133 Gestational [pregnancy-induced] hypertension without significant proteinuria, third trimester: Secondary | ICD-10-CM | POA: Diagnosis present

## 2020-01-04 DIAGNOSIS — Z3A37 37 weeks gestation of pregnancy: Secondary | ICD-10-CM | POA: Diagnosis not present

## 2020-01-04 DIAGNOSIS — O134 Gestational [pregnancy-induced] hypertension without significant proteinuria, complicating childbirth: Secondary | ICD-10-CM | POA: Diagnosis not present

## 2020-01-04 DIAGNOSIS — K449 Diaphragmatic hernia without obstruction or gangrene: Secondary | ICD-10-CM | POA: Diagnosis not present

## 2020-01-04 LAB — COMPREHENSIVE METABOLIC PANEL
ALT: 9 U/L (ref 0–44)
AST: 25 U/L (ref 15–41)
Albumin: 2.8 g/dL — ABNORMAL LOW (ref 3.5–5.0)
Alkaline Phosphatase: 142 U/L — ABNORMAL HIGH (ref 38–126)
Anion gap: 10 (ref 5–15)
BUN: 7 mg/dL (ref 6–20)
CO2: 20 mmol/L — ABNORMAL LOW (ref 22–32)
Calcium: 8.7 mg/dL — ABNORMAL LOW (ref 8.9–10.3)
Chloride: 106 mmol/L (ref 98–111)
Creatinine, Ser: 0.63 mg/dL (ref 0.44–1.00)
GFR, Estimated: >60 mL/min (ref >60)
Glucose, Bld: 79 mg/dL (ref 70–99)
Potassium: 4.2 mmol/L (ref 3.5–5.1)
Sodium: 136 mmol/L (ref 135–145)
Total Bilirubin: 0.6 mg/dL (ref 0.3–1.2)
Total Protein: 6.5 g/dL (ref 6.5–8.1)

## 2020-01-04 LAB — CBC
HCT: 38.5 % (ref 36.0–46.0)
HCT: 40.8 % (ref 36.0–46.0)
Hemoglobin: 12.4 g/dL (ref 12.0–15.0)
Hemoglobin: 13.1 g/dL (ref 12.0–15.0)
MCH: 29.2 pg (ref 26.0–34.0)
MCH: 28.9 pg (ref 26.0–34.0)
MCHC: 32.2 g/dL (ref 30.0–36.0)
MCHC: 32.1 g/dL (ref 30.0–36.0)
MCV: 90.6 fL (ref 80.0–100.0)
MCV: 90.1 fL (ref 80.0–100.0)
Platelets: 193 10*3/uL (ref 150–400)
Platelets: 180 10*3/uL (ref 150–400)
RBC: 4.25 MIL/uL (ref 3.87–5.11)
RBC: 4.53 MIL/uL (ref 3.87–5.11)
RDW: 13 % (ref 11.5–15.5)
RDW: 13.1 % (ref 11.5–15.5)
WBC: 9.9 10*3/uL (ref 4.0–10.5)
WBC: 10.3 10*3/uL (ref 4.0–10.5)
nRBC: 0 % (ref 0.0–0.2)
nRBC: 0 % (ref 0.0–0.2)

## 2020-01-04 LAB — TYPE AND SCREEN
ABO/RH(D): O POS
Antibody Screen: NEGATIVE

## 2020-01-04 LAB — RPR
RPR Ser Ql: REACTIVE — AB
RPR Titer: 1:2 {titer}

## 2020-01-04 MED ORDER — ACETAMINOPHEN 325 MG PO TABS: 650.0000 mg | ORAL_TABLET | ORAL | Status: DC | PRN

## 2020-01-04 MED ORDER — WITCH HAZEL-GLYCERIN EX PADS: 1.0000 "application " | MEDICATED_PAD | CUTANEOUS | Status: DC | PRN

## 2020-01-04 MED ORDER — OXYCODONE-ACETAMINOPHEN 5-325 MG PO TABS: 2.0000 | ORAL_TABLET | ORAL | Status: DC | PRN

## 2020-01-04 MED ORDER — MISOPROSTOL 25 MCG QUARTER TABLET
25.0000 ug | ORAL_TABLET | ORAL | Status: DC | PRN
  Administered 2020-01-04 (×2): 25 ug via VAGINAL
  Filled 2020-01-04 (×2): qty 1

## 2020-01-04 MED ORDER — DIPHENHYDRAMINE HCL 25 MG PO CAPS: 25.0000 mg | ORAL_CAPSULE | Freq: Four times a day (QID) | ORAL | Status: DC | PRN

## 2020-01-04 MED ORDER — ONDANSETRON HCL 4 MG PO TABS: 4.0000 mg | ORAL_TABLET | ORAL | Status: DC | PRN

## 2020-01-04 MED ORDER — MEDROXYPROGESTERONE ACETATE 150 MG/ML IM SUSP: 150.0000 mg | INTRAMUSCULAR | Status: DC | PRN

## 2020-01-04 MED ORDER — ONDANSETRON HCL 4 MG/2ML IJ SOLN: 4.0000 mg | Freq: Four times a day (QID) | INTRAMUSCULAR | Status: DC | PRN

## 2020-01-04 MED ORDER — COCONUT OIL OIL: 1.0000 "application " | TOPICAL_OIL | Status: DC | PRN

## 2020-01-04 MED ORDER — LACTATED RINGERS IV SOLN: INTRAVENOUS | Status: DC

## 2020-01-04 MED ORDER — LACTATED RINGERS IV SOLN: 500.0000 mL | INTRAVENOUS | Status: DC | PRN

## 2020-01-04 MED ORDER — BUTORPHANOL TARTRATE 1 MG/ML IJ SOLN: 1.0000 mg | INTRAMUSCULAR | Status: DC | PRN

## 2020-01-04 MED ORDER — LIDOCAINE HCL (PF) 1 % IJ SOLN: 30.0000 mL | INTRAMUSCULAR | Status: DC | PRN

## 2020-01-04 MED ORDER — OXYCODONE-ACETAMINOPHEN 5-325 MG PO TABS: 1.0000 | ORAL_TABLET | ORAL | Status: DC | PRN

## 2020-01-04 MED ORDER — EPHEDRINE 5 MG/ML INJ: 10.0000 mg | INTRAVENOUS | Status: DC | PRN

## 2020-01-04 MED ORDER — FENTANYL-BUPIVACAINE-NACL 0.5-0.125-0.9 MG/250ML-% EP SOLN
12.0000 mL/h | EPIDURAL | Status: DC | PRN
  Filled 2020-01-04: qty 250

## 2020-01-04 MED ORDER — TERBUTALINE SULFATE 1 MG/ML IJ SOLN: 0.2500 mg | Freq: Once | INTRAMUSCULAR | Status: DC | PRN

## 2020-01-04 MED ORDER — IBUPROFEN 600 MG PO TABS
600.0000 mg | ORAL_TABLET | Freq: Four times a day (QID) | ORAL | Status: DC
  Administered 2020-01-05 – 2020-01-06 (×7): 600 mg via ORAL
  Filled 2020-01-04 (×7): qty 1

## 2020-01-04 MED ORDER — SIMETHICONE 80 MG PO CHEW: 80.0000 mg | CHEWABLE_TABLET | ORAL | Status: DC | PRN

## 2020-01-04 MED ORDER — PHENYLEPHRINE 40 MCG/ML (10ML) SYRINGE FOR IV PUSH (FOR BLOOD PRESSURE SUPPORT): 80.0000 ug | PREFILLED_SYRINGE | INTRAVENOUS | Status: DC | PRN

## 2020-01-04 MED ORDER — OXYTOCIN-SODIUM CHLORIDE 30-0.9 UT/500ML-% IV SOLN
2.5000 [IU]/h | INTRAVENOUS | Status: DC
  Filled 2020-01-04: qty 500

## 2020-01-04 MED ORDER — LIDOCAINE-EPINEPHRINE (PF) 2 %-1:200000 IJ SOLN
INTRAMUSCULAR | Status: DC | PRN
  Administered 2020-01-04: 5 mL via EPIDURAL

## 2020-01-04 MED ORDER — LACTATED RINGERS IV SOLN: 500.0000 mL | Freq: Once | INTRAVENOUS | Status: DC

## 2020-01-04 MED ORDER — PRENATAL MULTIVITAMIN CH
1.0000 | ORAL_TABLET | Freq: Every day | ORAL | Status: DC
  Administered 2020-01-05 – 2020-01-06 (×2): 1 via ORAL
  Filled 2020-01-04 (×2): qty 1

## 2020-01-04 MED ORDER — DIBUCAINE (PERIANAL) 1 % EX OINT: 1.0000 "application " | TOPICAL_OINTMENT | CUTANEOUS | Status: DC | PRN

## 2020-01-04 MED ORDER — MEASLES, MUMPS & RUBELLA VAC IJ SOLR: 0.5000 mL | Freq: Once | INTRAMUSCULAR | Status: DC

## 2020-01-04 MED ORDER — TETANUS-DIPHTH-ACELL PERTUSSIS 5-2.5-18.5 LF-MCG/0.5 IM SUSY: 0.5000 mL | PREFILLED_SYRINGE | Freq: Once | INTRAMUSCULAR | Status: DC

## 2020-01-04 MED ORDER — SOD CITRATE-CITRIC ACID 500-334 MG/5ML PO SOLN: 30.0000 mL | ORAL | Status: DC | PRN

## 2020-01-04 MED ORDER — OXYTOCIN BOLUS FROM INFUSION
333.0000 mL | Freq: Once | INTRAVENOUS | Status: AC
  Administered 2020-01-04: 333 mL via INTRAVENOUS

## 2020-01-04 MED ORDER — BENZOCAINE-MENTHOL 20-0.5 % EX AERO: 1.0000 "application " | INHALATION_SPRAY | CUTANEOUS | Status: DC | PRN

## 2020-01-04 MED ORDER — DIPHENHYDRAMINE HCL 50 MG/ML IJ SOLN: 12.5000 mg | INTRAMUSCULAR | Status: DC | PRN

## 2020-01-04 MED ORDER — SENNOSIDES-DOCUSATE SODIUM 8.6-50 MG PO TABS
2.0000 | ORAL_TABLET | ORAL | Status: DC
  Administered 2020-01-05 (×2): 2 via ORAL
  Filled 2020-01-04 (×2): qty 2

## 2020-01-04 MED ORDER — OXYTOCIN-SODIUM CHLORIDE 30-0.9 UT/500ML-% IV SOLN: 1.0000 m[IU]/min | INTRAVENOUS | Status: DC

## 2020-01-04 MED ORDER — PHENYLEPHRINE 40 MCG/ML (10ML) SYRINGE FOR IV PUSH (FOR BLOOD PRESSURE SUPPORT)
80.0000 ug | PREFILLED_SYRINGE | INTRAVENOUS | Status: DC | PRN
  Filled 2020-01-04: qty 10

## 2020-01-04 MED ORDER — ZOLPIDEM TARTRATE 5 MG PO TABS: 5.0000 mg | ORAL_TABLET | Freq: Every evening | ORAL | Status: DC | PRN

## 2020-01-04 MED ORDER — ONDANSETRON HCL 4 MG/2ML IJ SOLN: 4.0000 mg | INTRAMUSCULAR | Status: DC | PRN

## 2020-01-04 MED ORDER — SODIUM CHLORIDE (PF) 0.9 % IJ SOLN
INTRAMUSCULAR | Status: DC | PRN
  Administered 2020-01-04: 12 mL/h via EPIDURAL

## 2020-01-04 NOTE — Anesthesia Procedure Notes (Signed)
Epidural Patient location during procedure: OB Start time: 01/04/2020 3:32 PM End time: 01/04/2020 3:42 PM  Staffing Anesthesiologist: Elmer Picker, MD Performed: anesthesiologist   Preanesthetic Checklist Completed: patient identified, IV checked, risks and benefits discussed, monitors and equipment checked, pre-op evaluation and timeout performed  Epidural Patient position: sitting Prep: DuraPrep and site prepped and draped Patient monitoring: continuous pulse ox, blood pressure, heart rate and cardiac monitor Approach: midline Location: L3-L4 Injection technique: LOR air  Needle:  Needle type: Tuohy  Needle gauge: 17 G Needle length: 9 cm Needle insertion depth: 5 cm Catheter type: closed end flexible Catheter size: 19 Gauge Catheter at skin depth: 11 cm Test dose: negative  Assessment Sensory level: T8 Events: blood not aspirated, injection not painful, no injection resistance, no paresthesia and negative IV test  Additional Notes Patient identified. Risks/Benefits/Options discussed with patient including but not limited to bleeding, infection, nerve damage, paralysis, failed block, incomplete pain control, headache, blood pressure changes, nausea, vomiting, reactions to medication both or allergic, itching and postpartum back pain. Confirmed with bedside nurse the patient's most recent platelet count. Confirmed with patient that they are not currently taking any anticoagulation, have any bleeding history or any family history of bleeding disorders. Patient expressed understanding and wished to proceed. All questions were answered. Sterile technique was used throughout the entire procedure. Please see nursing notes for vital signs. Test dose was given through epidural catheter and negative prior to continuing to dose epidural or start infusion. Warning signs of high block given to the patient including shortness of breath, tingling/numbness in hands, complete motor block,  or any concerning symptoms with instructions to call for help. Patient was given instructions on fall risk and not to get out of bed. All questions and concerns addressed with instructions to call with any issues or inadequate analgesia.  Reason for block:procedure for pain

## 2020-01-04 NOTE — Plan of Care (Signed)

## 2020-01-04 NOTE — Anesthesia Preprocedure Evaluation (Signed)
Anesthesia Evaluation  Patient identified by MRN, date of birth, ID band Patient awake    Reviewed: Allergy & Precautions, NPO status , Patient's Chart, lab work & pertinent test results  Airway Mallampati: II  TM Distance: >3 FB Neck ROM: Full    Dental no notable dental hx.    Pulmonary neg pulmonary ROS,    Pulmonary exam normal breath sounds clear to auscultation       Cardiovascular hypertension, negative cardio ROS Normal cardiovascular exam Rhythm:Regular Rate:Normal     Neuro/Psych negative neurological ROS  negative psych ROS   GI/Hepatic Neg liver ROS, hiatal hernia, GERD  ,  Endo/Other  negative endocrine ROS  Renal/GU negative Renal ROS  negative genitourinary   Musculoskeletal negative musculoskeletal ROS (+)   Abdominal   Peds  Hematology negative hematology ROS (+)   Anesthesia Other Findings IOL gHTN  Reproductive/Obstetrics                             Anesthesia Physical Anesthesia Plan  ASA: III  Anesthesia Plan: Epidural   Post-op Pain Management:    Induction:   PONV Risk Score and Plan: Treatment may vary due to age or medical condition  Airway Management Planned: Natural Airway  Additional Equipment:   Intra-op Plan:   Post-operative Plan:   Informed Consent: I have reviewed the patients History and Physical, chart, labs and discussed the procedure including the risks, benefits and alternatives for the proposed anesthesia with the patient or authorized representative who has indicated his/her understanding and acceptance.       Plan Discussed with: Anesthesiologist  Anesthesia Plan Comments: (Patient identified. Risks, benefits, options discussed with patient including but not limited to bleeding, infection, nerve damage, paralysis, failed block, incomplete pain control, headache, blood pressure changes, nausea, vomiting, reactions to medication,  itching, and post partum back pain. Confirmed with bedside nurse the patient's most recent platelet count. Confirmed with the patient that they are not taking any anticoagulation, have any bleeding history or any family history of bleeding disorders. Patient expressed understanding and wishes to proceed. All questions were answered. )        Anesthesia Quick Evaluation

## 2020-01-04 NOTE — H&P (Signed)
Christie [name] is a 34 y.o. female presenting for IOL due to Gestational HTN.  GBS-. OB History    Gravida  2   Para  1   Term  1   Preterm      AB      Living  1     SAB      TAB      Ectopic      Multiple  0   Live Births  1          Past Medical History:  Diagnosis Date  . GERD (gastroesophageal reflux disease)   . History of gestational hypertension   . Pregnancy induced hypertension    Past Surgical History:  Procedure Laterality Date  . NO PAST SURGERIES    . WISDOM TOOTH EXTRACTION     Family History: family history includes Hypertension in her mother; Melanoma in her mother; Thyroid disease in her mother. Social History:  reports that she has never smoked. She has never used smokeless tobacco. She reports that she does not drink alcohol and does not use drugs.     Maternal Diabetes: No Genetic Screening: Normal Maternal Ultrasounds/Referrals: Normal Fetal Ultrasounds or other Referrals:  None Maternal Substance Abuse:  No Significant Maternal Medications:  None Significant Maternal Lab Results:  Group B Strep negative Other Comments:  None  Review of Systems History Dilation: 2 (funnel) Effacement (%): Thick Station: Ballotable Exam by:: Lorelle Formosa RN, Amiee RN Blood pressure (!) 146/96, pulse 83, temperature 98 F (36.7 C), resp. rate 16, height 5\' 4"  (1.626 m), weight 85.1 kg, unknown if currently breastfeeding. Exam Physical Exam  DTRs 1/4 No clonus Prenatal labs: ABO, Rh: --/--/O POS (10/25 [birth year]-1982) Antibody: NEG (10/25 0739) Rubella: Immune (04/05 0000) RPR: Nonreactive (04/05 0000)  HBsAg: Negative (04/05 0000)  HIV: Non-reactive (04/05 0000)  GBS: Negative/-- (10/08 0000)   Assessment/Plan: IUP at [redacted] weeks Gestational HTN - normal labs and mild range BPs Just arrive this am for cytotec (2 stage) will AROM/Pit when able and anticipate SVD   05-13-1984 01/04/2020, 9:36 AM

## 2020-01-04 NOTE — Lactation Note (Signed)
This note was copied from a baby's chart. Lactation Consultation Note Attempted to see mom. Room dark. Mom looked up at [facility]. LC introduced self. Asked mom to call for assistance when baby is wanting to feed and I'll try to come. Mom smiles stated OK.  Patient Name: Christie [name] HUDJS'H Date: 01/04/2020     Maternal Data    Feeding Feeding Type: Breast Milk  LATCH Score                   Interventions    Lactation Tools Discussed/Used     Consult Status      Charyl Dancer 01/04/2020, 11:59 PM

## 2020-01-05 LAB — CBC
HCT: 38.1 % (ref 36.0–46.0)
Hemoglobin: 12.2 g/dL (ref 12.0–15.0)
MCH: 29.2 pg (ref 26.0–34.0)
MCHC: 32 g/dL (ref 30.0–36.0)
MCV: 91.1 fL (ref 80.0–100.0)
Platelets: 166 10*3/uL (ref 150–400)
RBC: 4.18 MIL/uL (ref 3.87–5.11)
RDW: 13 % (ref 11.5–15.5)
WBC: 11.7 10*3/uL — ABNORMAL HIGH (ref 4.0–10.5)
nRBC: 0 % (ref 0.0–0.2)

## 2020-01-05 LAB — T.PALLIDUM AB, TOTAL: T Pallidum Abs: NONREACTIVE

## 2020-01-05 MED ORDER — LABETALOL HCL 200 MG PO TABS
200.0000 mg | ORAL_TABLET | Freq: Two times a day (BID) | ORAL | Status: DC
  Administered 2020-01-05 (×2): 200 mg via ORAL
  Filled 2020-01-05 (×2): qty 1

## 2020-01-05 NOTE — Progress Notes (Addendum)
PPD #1  Ambulating, voiding No HA, no vision change  Today's Vitals   01/04/20 2015 01/04/20 2120 01/05/20 0115 01/05/20 0515  BP: (!) 147/94 (!) 157/98 (!) 139/93 (!) 142/97  Pulse: 80 93 79 78  Resp: 18 17 18 17   Temp: 98.3 F (36.8 C) 98.2 F (36.8 C) 98.4 F (36.9 C) 98.4 F (36.9 C)  TempSrc: Oral Oral Oral Oral  SpO2: 100% 96% 98% 97%  Weight:      Height:      PainSc: 0-No pain 0-No pain 0-No pain 4    Body mass index is 32.2 kg/m.   FFNT  Results for orders placed or performed during the hospital encounter of 01/04/20 (from the past 24 hour(s))  CBC     Status: None   Collection Time: 01/04/20  7:02 AM  Result Value Ref Range   WBC 9.9 4.0 - 10.5 K/uL   RBC 4.25 3.87 - 5.11 MIL/uL   Hemoglobin 12.4 12.0 - 15.0 g/dL   HCT 01/06/20 36 - 46 %   MCV 90.6 80.0 - 100.0 fL   MCH 29.2 26.0 - 34.0 pg   MCHC 32.2 30.0 - 36.0 g/dL   RDW 82.9 93.7 - 16.9 %   Platelets 193 150 - 400 K/uL   nRBC 0.0 0.0 - 0.2 %  RPR     Status: Abnormal   Collection Time: 01/04/20  7:02 AM  Result Value Ref Range   RPR Ser Ql Reactive (A) NON REACTIVE   RPR Titer 1:2   Comprehensive metabolic panel     Status: Abnormal   Collection Time: 01/04/20  7:02 AM  Result Value Ref Range   Sodium 136 135 - 145 mmol/L   Potassium 4.2 3.5 - 5.1 mmol/L   Chloride 106 98 - 111 mmol/L   CO2 20 (L) 22 - 32 mmol/L   Glucose, Bld 79 70 - 99 mg/dL   BUN 7 6 - 20 mg/dL   Creatinine, Ser 01/06/20 0.44 - 1.00 mg/dL   Calcium 8.7 (L) 8.9 - 10.3 mg/dL   Total Protein 6.5 6.5 - 8.1 g/dL   Albumin 2.8 (L) 3.5 - 5.0 g/dL   AST 25 15 - 41 U/L   ALT 9 0 - 44 U/L   Alkaline Phosphatase 142 (H) 38 - 126 U/L   Total Bilirubin 0.6 0.3 - 1.2 mg/dL   GFR, Estimated 9.38 >10 mL/min   Anion gap 10 5 - 15  Type and screen [facility]     Status: None   Collection Time: 01/04/20  7:39 AM  Result Value Ref Range   ABO/RH(D) O POS    Antibody Screen NEG    Sample Expiration       01/07/2020,2359 Performed at [facility], [address], [place], [place]   CBC     Status: None   Collection Time: 01/04/20 12:32 PM  Result Value Ref Range   WBC 10.3 4.0 - 10.5 K/uL   RBC 4.53 3.87 - 5.11 MIL/uL   Hemoglobin 13.1 12.0 - 15.0 g/dL   HCT 01/06/20 36 - 46 %   MCV 90.1 80.0 - 100.0 fL   MCH 28.9 26.0 - 34.0 pg   MCHC 32.1 30.0 - 36.0 g/dL   RDW 85.2 77.8 - 24.2 %   Platelets 180 150 - 400 K/uL   nRBC 0.0 0.0 - 0.2 %  CBC     Status: Abnormal   Collection Time: 01/05/20  5:27 AM  Result Value Ref Range   WBC 11.7 (H) 4.0 - 10.5 K/uL   RBC 4.18 3.87 - 5.11 MIL/uL   Hemoglobin 12.2 12.0 - 15.0 g/dL   HCT 77.8 36 - 46 %   MCV 91.1 80.0 - 100.0 fL   MCH 29.2 26.0 - 34.0 pg   MCHC 32.0 30.0 - 36.0 g/dL   RDW 24.2 35.3 - 61.4 %   Platelets 166 150 - 400 K/uL   nRBC 0.0 0.0 - 0.2 %   A/P: Gestational Hypertension         Start labetalol 200 mg po BID         D/W circumcision of newborn boy, risks reviewed. Patient states she understands and agrees.

## 2020-01-05 NOTE — Anesthesia Postprocedure Evaluation (Signed)
Anesthesia Post Note  Patient: Christie [name]  Procedure(s) Performed: AN AD HOC LABOR EPIDURAL     Patient location during evaluation: Mother Baby Anesthesia Type: Epidural Level of consciousness: awake and alert and oriented Pain management: satisfactory to patient Vital Signs Assessment: post-procedure vital signs reviewed and stable Respiratory status: respiratory function stable Cardiovascular status: stable Postop Assessment: no headache, no backache, epidural receding, patient able to bend at knees, no signs of nausea or vomiting, adequate PO intake and able to ambulate Anesthetic complications: no   No complications documented.  Last Vitals:  Vitals:   01/05/20 0115 01/05/20 0515  BP: (!) 139/93 (!) 142/97  Pulse: 79 78  Resp: 18 17  Temp: 36.9 C 36.9 C  SpO2: 98% 97%    Last Pain:  Vitals:   01/05/20 0800  TempSrc:   PainSc: 0-No pain   Pain Goal: Patients Stated Pain Goal: 0 (01/04/20 1730)                 Karleen Dolphin

## 2020-01-05 NOTE — Lactation Note (Signed)
This note was copied from a baby's chart. Lactation Consultation Note Baby 8 hrs old. Mom having difficulty latching. Mom had to use NS w/her now 32 yr old that she BF and pumped for 5 weeks.  Mom has short shaft everted nipples. Lt. Nipple flattens at rest. Rt. Nipple larger and more everted. Mom demonstrated hand expression w/good colostrum. Gave mom LPI information sheet d/t baby BW is 6.0 lbs baby will drop less than 6 lbs. Discussed supplementing w/EBM or Donor milk. Baby jittery when unwrapped. Reported to RN.  Discussed the need for pumping. Mom in agreement. Mom shown how to use DEBP & how to disassemble, clean, & reassemble parts. Mom knows to pump q3h for 15-20 min.  Mom encouraged to feed baby 8-12 times/24 hours and with feeding cues. Mom encouraged to waken baby for feeds if hasn't cued in 3 hrs.  Mom latched to breast. Mom stated it hurts really bad. NS applied. Flanged lips. Mom stated it hurts bad, its a little better than being w/o NS #20 but still hurts. Baby has a very small mouth. LC assessed w/gloved finger. Baby wouldn't extend his tongue. LC had to continuously flange lips when latched. Mom stated it was hurting to bad.  When removed NS looked slightly blister to Lt. Nipple.  Comfort gels given.  Discussed mom supplementing letting her nipples rest. No Donor milk available. Mom is hand expressing and will give colostrum in spoon. Mom working on that when [facility] left.  Encouraged to call for assistance if needed. Lactation brochure given. Reported to RN.  Patient Name: Christie [name] JOACZ'Y Date: 01/05/2020 Reason for consult: Initial assessment;Early term 37-38.6wks;Nipple pain/trauma   Maternal Data Has patient been taught Hand Expression?: Yes Does the patient have breastfeeding experience prior to this delivery?: Yes  Feeding Feeding Type: Breast Fed  LATCH Score Latch: Repeated attempts needed to sustain latch, nipple held in mouth throughout  feeding, stimulation needed to elicit sucking reflex.  Audible Swallowing: None  Type of Nipple: Everted at rest and after stimulation (very short shaft)  Comfort (Breast/Nipple): Filling, red/small blisters or bruises, mild/mod discomfort (tender, looked blistered when finished suckling)  Hold (Positioning): Assistance needed to correctly position infant at breast and maintain latch.  LATCH Score: 5  Interventions Interventions: Support pillows;Breast feeding basics reviewed;Assisted with latch;Position options;Skin to skin;Expressed milk;Breast massage;Hand express;Pre-pump if needed;Comfort gels;Reverse pressure;DEBP;Breast compression;Adjust position  Lactation Tools Discussed/Used Tools: Pump;Comfort gels;Nipple Shields Nipple shield size: 20 Breast pump type: Double-Electric Breast Pump WIC Program: No Pump Review: Setup, frequency, and cleaning;Milk Storage Initiated by:: Peri Jefferson RN IBCLC Date initiated:: 01/05/20   Consult Status Consult Status: Follow-up Date: 01/05/20 Follow-up type: In-patient    [name], Diamond Nickel 01/05/2020, 2:35 AM

## 2020-01-06 MED ORDER — LABETALOL HCL 200 MG PO TABS
400.0000 mg | ORAL_TABLET | Freq: Two times a day (BID) | ORAL | Status: DC
  Administered 2020-01-06: 400 mg via ORAL
  Filled 2020-01-06: qty 2

## 2020-01-06 NOTE — Progress Notes (Signed)
Patients BP taken prior to given Labetalol 400mg  BID. BP reading 138/94, MD called and value given. Per Belva Agee MD cont with giving Labetalol 400mg  BID and recheck BP in 1hr and call with results. Elon Spanner RN

## 2020-01-06 NOTE — Discharge Summary (Signed)
Postpartum Discharge Summary  Date of Service updated 01/06/20     Patient Name: Christie [name] DOB: Apr 13, 1985 MRN: [id number]  Date of admission: 01/04/2020 Delivery date:01/04/2020  Delivering provider: Louretta Shorten  Date of discharge: 01/06/2020  Admitting diagnosis: Gestational hypertension, third trimester [O13.3] Intrauterine pregnancy: [redacted]w[redacted]d    Secondary diagnosis:  Active Problems:   Gestational hypertension, third trimester  Additional problems: gestational HTN requiring Labetalol BID 403m   Discharge diagnosis: Term Pregnancy Delivered and Gestational Hypertension                                              Post partum procedures:n/a Augmentation: AROM and Pitocin Complications: None  Hospital course: Induction of Labor With Vaginal Delivery   [age].o. yo G2P2002 at 3764w6ds admitted to the hospital 01/04/2020 for induction of labor.  Indication for induction: Gestational hypertension.  Patient had an uncomplicated labor course as follows: Membrane Rupture Time/Date: 2:20 PM ,01/04/2020   Delivery Method:Vaginal, Spontaneous  Episiotomy: None  Lacerations:  None  Details of delivery can be found in separate delivery note.  Patient had a routine postpartum course. Patient is discharged home 01/06/20.  Newborn Data: Birth date:01/04/2020  Birth time:6:11 PM  Gender:Female  Living status:Living  Apgars:9 ,9  Weight:2741 g   Magnesium Sulfate received: No BMZ received: No Rhophylac:N/A MMR:N/A T-DaP:Given prenatally Flu: N/A Transfusion:No  Physical exam  Vitals:   01/05/20 2115 01/06/20 0515 01/06/20 1006 01/06/20 1203  BP: (!) 141/97 (!) 140/92 (!) 138/94 120/88  Pulse: 84 80 89 91  Resp: 17 18    Temp: 97.8 F (36.6 C) 98 F (36.7 C)    TempSrc: Axillary Oral    SpO2: 100% 99%    Weight:      Height:       General: alert Lochia: appropriate Uterine Fundus: firm Incision: N/A DVT Evaluation: No evidence of DVT seen on physical  exam. Labs: Lab Results  Component Value Date   WBC 11.7 (H) 01/05/2020   HGB 12.2 01/05/2020   HCT 38.1 01/05/2020   MCV 91.1 01/05/2020   PLT 166 01/05/2020   CMP Latest Ref Rng & Units 01/04/2020  Glucose 70 - 99 mg/dL 79  BUN 6 - 20 mg/dL 7  Creatinine 0.44 - 1.00 mg/dL 0.63  Sodium 135 - 145 mmol/L 136  Potassium 3.5 - 5.1 mmol/L 4.2  Chloride 98 - 111 mmol/L 106  CO2 22 - 32 mmol/L 20(L)  Calcium 8.9 - 10.3 mg/dL 8.7(L)  Total Protein 6.5 - 8.1 g/dL 6.5  Total Bilirubin 0.3 - 1.2 mg/dL 0.6  Alkaline Phos 38 - 126 U/L 142(H)  AST 15 - 41 U/L 25  ALT 0 - 44 U/L 9   Edinburgh Score: Edinburgh Postnatal Depression Scale Screening Tool 01/06/2020  I have been able to laugh and see the funny side of things. 0  I have looked forward with enjoyment to things. 0  I have blamed myself unnecessarily when things went wrong. 0  I have been anxious or worried for no good reason. 2  I have felt scared or panicky for no good reason. 1  Things have been getting on top of me. 1  I have been so unhappy that I have had difficulty sleeping. 0  I have felt sad or miserable. 0  I have been so unhappy that  I have been crying. 0  The thought of harming myself has occurred to me. 0  Edinburgh Postnatal Depression Scale Total 4      After visit meds:  Allergies as of 01/06/2020      Reactions   Bactrim [sulfamethoxazole-trimethoprim] Hives   Body hives and lip swelling      Medication List    STOP taking these medications   amoxicillin 500 MG capsule Commonly known as: AMOXIL   cyclobenzaprine 10 MG tablet Commonly known as: FLEXERIL   ondansetron 4 MG tablet Commonly known as: ZOFRAN     TAKE these medications   prenatal multivitamin Tabs tablet Take 1 tablet by mouth daily at 12 noon.        Discharge home in stable condition Infant Feeding: Breast Infant Disposition:home with mother Discharge instruction: per After Visit Summary and Postpartum  booklet. Activity: Advance as tolerated. Pelvic rest for 6 weeks.  Diet: routine diet Anticipated Birth Control: Unsure Postpartum Appointment:2-3 days Additional Postpartum F/U: BP check 2-3 days Future Appointments:No future appointments. Follow up Visit:      01/06/2020 Tyson Dense, MD

## 2020-01-06 NOTE — Progress Notes (Signed)
Christie [name] given rpt BP of 120/88 and will place d/c order in.

## 2020-01-06 NOTE — Lactation Note (Signed)
This note was copied from a baby's chart. Lactation Consultation Note  Patient Name: Christie [name] GEXBM'W Date: 01/06/2020 Reason for consult: Follow-up assessment;Early term 37-38.6wks;Infant weight loss  Baby is 48 hours old , for D/C today ,  Per mom the night LC Vernona Rieger mentioned she felt the baby had a tongue tie.  LC offered to assess the oral cavity with gloved fingers and noted the upper lip to stretch well , the skin notch midline is above the gum line. Noted a small indentation and skin notch  under the the midline section of the tongue and baby is able to lift it well . When sucking on a gloved finger the baby does keep the back of the tongue downward.  Baby awake and hungry , LC changed a small wet diaper.  LC resized mom for the #20 NS and felt it was borderline for being snug.  Baby latched and accommodated the base well and fed 15 mins with colostrum noted in the NS .  Baby still hungry and LC resized for a #24 NS and latch and increased swallows noted and baby fed for 15 mins with small amount of colostrum at the base of the NS after baby released.  Mom plans to post pump both breast and has a DEBP at home.  Per mom the [facility] office has a LC there and she will check for LC F/U O/P appt.  Mom requested and [facility] written plan and LC wrote out steps involving Nipple Shield , extra pumping and supplementing. Sore nipple and engorgement prevention and tx .  Per mom has shells at home and LC recommended using them alternating with  Comfort gels x 6 days.  Mom has the [facility] pamphlet with phone numbers.  Mom, dad and grandmother receptive to teaching.   Maternal Data Has patient been taught Hand Expression?: Yes Does the patient have breastfeeding experience prior to this delivery?: Yes  Feeding Feeding Type: Breast Fed  LATCH Score Latch: Grasps breast easily, tongue down, lips flanged, rhythmical sucking.  Audible Swallowing: A few with stimulation (increased with moist heat and  compressions )  Type of Nipple: Everted at rest and after stimulation  Comfort (Breast/Nipple): Soft / non-tender  Hold (Positioning): Assistance needed to correctly position infant at breast and maintain latch.  LATCH Score: 8  Interventions Interventions: Breast feeding basics reviewed;Assisted with latch;Skin to skin;Breast compression;Adjust position;Support pillows;Position options  Lactation Tools Discussed/Used     Consult Status Consult Status: Complete Date: 01/06/20    Kathrin Greathouse 01/06/2020, 1:26 PM

## 2020-01-06 NOTE — Progress Notes (Signed)
Post Partum Day 1 Subjective: no complaints, up ad lib, voiding and tolerating PO  Objective: Blood pressure (!) 140/92, pulse 80, temperature 98 F (36.7 C), temperature source Oral, resp. rate 18, height 5\' 4"  (1.626 m), weight 85.1 kg, SpO2 99 %, unknown if currently breastfeeding.  Physical Exam:  General: alert Lochia: appropriate Uterine Fundus: firm Incision: n/a DVT Evaluation: No evidence of DVT seen on physical exam.  Recent Labs    01/04/20 1232 01/05/20 0527  HGB 13.1 12.2  HCT 40.8 38.1    Assessment/Plan: PPD2 s/p SVD after IOL for gHTN. On Labetalol 200mg  BID for persistently elevated Bps. Bps improved to 120s/-140s/90s but still mildly elevated. Asymptomatic. Will increase to Labetalol 400mg  BID and if improved, d/w later today.     LOS: 2 days   01/07/20 01/06/2020, 7:15 AM

## 2020-01-12 DIAGNOSIS — O9270 Unspecified disorders of lactation: Secondary | ICD-10-CM | POA: Diagnosis not present

## 2020-01-12 DIAGNOSIS — O925 Suppressed lactation: Secondary | ICD-10-CM | POA: Diagnosis not present

## 2020-02-16 DIAGNOSIS — Z1389 Encounter for screening for other disorder: Secondary | ICD-10-CM | POA: Diagnosis not present

## 2020-02-16 DIAGNOSIS — Z3040 Encounter for surveillance of contraceptives, unspecified: Secondary | ICD-10-CM | POA: Diagnosis not present

## 2020-06-10 DIAGNOSIS — S060XAA Concussion with loss of consciousness status unknown, initial encounter: Secondary | ICD-10-CM | POA: Insufficient documentation

## 2020-06-15 ENCOUNTER — Ambulatory Visit: Payer: Self-pay | Admitting: Medical

## 2020-06-15 ENCOUNTER — Other Ambulatory Visit: Payer: Self-pay

## 2020-06-15 ENCOUNTER — Encounter: Payer: Self-pay | Admitting: Medical

## 2020-06-15 VITALS — BP 118/78 | HR 90 | Temp 97.7°F | Resp 16 | Wt 169.4 lb

## 2020-06-15 DIAGNOSIS — S060X1A Concussion with loss of consciousness of 30 minutes or less, initial encounter: Secondary | ICD-10-CM

## 2020-06-15 NOTE — Patient Instructions (Signed)
Concussion, Adult  A concussion is a brain injury from a hard, direct hit (trauma) to your head or body. This direct hit causes your brain to quickly shake back and forth inside your skull. A concussion may also be called a mild traumatic brain injury (TBI). Healing from this injury can take time. What are the causes? This condition is caused by:  A direct hit to your head, such as: ? Running into a player during a game. ? Being hit in a fight. ? Hitting your head on a hard surface.  A quick and sudden movement of the head or neck, such as in a car crash. What are the signs or symptoms? The signs of a concussion can be hard to notice. They may be missed by you, family members, and doctors. You may look fine on the outside but may not act or feel normal. Physical symptoms  Headaches.  Being dizzy.  Problems with body balance.  Being sensitive to light or noise.  Vomiting or feeling like you may vomit.  Being tired.  Problems seeing or hearing.  Not sleeping or eating as you used to.  Seizure. Mental and emotional symptoms  Feeling grouchy (irritable).  Having mood changes.  Problems remembering things.  Trouble focusing your mind (concentrating), organizing, or making decisions.  Being slow to think, act, react, speak, or read.  Feeling worried or nervous (anxious).  Feeling sad (depressed). How is this treated? This condition may be treated by:  Stopping sports or activity if you are injured. If you hit your head or have signs of concussion: ? Do not return to sports or activities the same day. ? Get checked by a doctor before you return to your activities.  Resting your body and your mind.  Being watched carefully, often at home.  Medicines to help with symptoms such as: ? Headaches. ? Feeling like you may vomit. ? Problems with sleep.  Avoiding alcohol and drugs.  Being asked to go to a concussion clinic or a place to help you recover  (rehabilitation center). Recovery from a concussion can take time. Return to activities only:  When you are fully healed.  When your doctor says it is safe. Avoid taking strong pain medicines (opioids) for a concussion. Follow these instructions at home: Activity  Limit activities that need a lot of thought or focus, such as: ? Homework or work for your job. ? Watching TV. ? Using the computer or phone. ? Playing memory games and puzzles.  Rest. Rest helps your brain heal. Make sure you: ? Get plenty of sleep. Most adults should get 7-9 hours of sleep each night. ? Rest during the day. Take naps or breaks when you feel tired.  Avoid activity like exercise until your doctor says its safe. Stop any activity that makes symptoms worse.  Do not do activities that could cause a second concussion, such as riding a bike or playing sports.  Ask your doctor when you can return to your normal activities, such as school, work, sports, and driving. Your ability to react may be slower. Do not do these activities if you are dizzy. General instructions  Take over-the-counter and prescription medicines only as told by your doctor.  Do not drink alcohol until your doctor says you can.  Watch your symptoms and tell other people to do the same. Other problems can occur after a concussion. Older adults have a higher risk of serious problems.  Tell your work manager, teachers, school nurse, school   counselor, coach, or athletic trainer about your injury and symptoms. Tell them about what you can or cannot do.  Keep all follow-up visits as told by your doctor. This is important.   How is this prevented? It is very important that you do not get another brain injury. In rare cases, another injury can cause brain damage that will not go away, brain swelling, or death. The risk of this is greatest in the first 7-10 days after a head injury. To avoid injuries:  Stop activities that could lead to a second  concussion, such as contact sports, until your doctor says it is okay.  When you return to sports or activities: ? Do not crash into other players. This is how most concussions happen. ? Follow the rules. ? Respect other players. Do not engage in violent behavior while playing.  Get regular exercise. Do strength and balance training.  Wear a helmet that fits you well during sports, biking, or other activities.  Helmets can help protect you from serious skull and brain injuries, but they do not protect you from a concussion. Even when wearing a helmet, you should avoid being hit in the head. Contact a doctor if:  Your symptoms do not get better.  You have new symptoms.  You have another injury. Get help right away if:  You have bad headaches or your headaches get worse.  You feel weak or numb in any part of your body.  You feel mixed up (confused).  Your balance gets worse.  You vomit often.  You feel more sleepy than normal.  You cannot speak well, or have slurred speech.  You have a seizure.  Others have trouble waking you up.  You have changes in how you act.  You have changes in how you see (vision).  You pass out (lose consciousness). These symptoms may be an emergency. Do not wait to see if the symptoms will go away. Get medical help right away. Call your local emergency services (911 in the [place] Do not drive yourself to the hospital. Summary  A concussion is a brain injury from a hard, direct hit (trauma) to your head or body.  This condition is treated with rest and careful watching of symptoms.  Ask your doctor when you can return to your normal activities, such as school, work, or driving.  Get help right away if you have a very bad headache, feel weak in any part of your body, have a seizure, have changes in how you act or see, or if you are mixed up or more sleepy than normal. This information is not intended to replace advice given to you by your  health care provider. Make sure you discuss any questions you have with your health care provider. Document Revised: 01/08/2019 Document Reviewed: 01/08/2019 Elsevier Patient Education  2021 [employer]  

## 2020-06-15 NOTE — Progress Notes (Signed)
Subjective:    Patient ID: Christie [name], female    DOB: 13-Oct-1985, 35 y.o.   MRN: [id number]  HPI  35 yo female in non acute distress, presents today after hitting her  with complaints of  Thursday 10 times of diarrhea Previous history of  Getting calming feeling with passing out.  Has occurred 3-4 times in her life. Mother with similar symptoms occurs when she is sick too.   Hopped out of bed and got  Has a 12 month old child feeding him a bottle had a wave of nausea come over her called to husband to get child, gave child to husband, patient stood up took  2 steps, , fell backwards and hit buttock right side , elbow on right side and right posterior shoulder and head. ( she hear herself crash when she feel and blacked out. LOC she states lasted 2 secs. She went back to work the next day.   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  PCP [facility]  Blood pressure 118/78, pulse 90, temperature 97.7 F (36.5 C), temperature source Oral, resp. rate 16, weight 169 lb 6.4 oz (76.8 kg), last menstrual period 06/12/2020, SpO2 98 %, unknown if currently breastfeeding.  Allergies  Allergen Reactions  . Bactrim [Sulfamethoxazole-Trimethoprim] Hives    Body hives and lip swelling    Review of Systems See ACE form in Scanned media.    Objective:   Physical Exam Vitals and nursing note reviewed.  Constitutional:      Appearance: Normal appearance.  HENT:     Head: Normocephalic and atraumatic.     Jaw: There is normal jaw occlusion.     Right Ear: Tympanic membrane, ear canal and external ear normal.     Left Ear: Tympanic membrane, ear canal and external ear normal.     Nose: Nose normal.     Mouth/Throat:     Mouth: Mucous membranes are moist.     Pharynx: Oropharynx is clear.  Eyes:     General: Lids are normal. Vision grossly intact. Gaze aligned appropriately.     Extraocular Movements: Extraocular movements intact.      Conjunctiva/sclera: Conjunctivae normal.     Pupils: Pupils are equal, round, and reactive to light.  Cardiovascular:     Pulses:          Radial pulses are 2+ on the right side and 2+ on the left side.  Musculoskeletal:        General: Tenderness (mild right elbow, olecrenon process) present. Normal range of motion.     Cervical back: Normal range of motion and neck supple. No tenderness.  Feet:     Right foot:     Skin integrity: Skin integrity normal.     Toenail Condition: Right toenails are normal.     Left foot:     Skin integrity: Skin integrity normal.     Toenail Condition: Left toenails are normal.  Lymphadenopathy:     Cervical: No cervical adenopathy.  Skin:    General: Skin is warm and dry.     Capillary Refill: Capillary refill takes less than 2 seconds.  Neurological:     General: No focal deficit present.     Mental Status: She is alert. Mental status is at baseline.     GCS: GCS eye subscore is 4. GCS verbal subscore is 5. GCS motor subscore is 6.     Cranial Nerves: No cranial nerve deficit.  Sensory: No sensory deficit.     Motor: No weakness, tremor, abnormal muscle tone or pronator drift.     Coordination: Coordination is intact. Romberg sign negative. Coordination normal. Finger-Nose-Finger Test and Heel to [facility] Test normal.     Gait: Gait is intact. Gait normal.     Deep Tendon Reflexes: Reflexes normal.     Reflex Scores:      Brachioradialis reflexes are 2+ on the right side and 2+ on the left side.      Patellar reflexes are 2+ on the right side and 2+ on the left side.      Achilles reflexes are 2+ on the right side and 2+ on the left side.    Comments: CN II- XII intact  Psychiatric:        Mood and Affect: Mood normal.        Behavior: Behavior normal. Behavior is cooperative.        Thought Content: Thought content normal.        Judgment: Judgment normal.           Assessment & Plan:  Concussion  7 days  Post ACE score 13 Rest,no  computer or cell phone will reevaluate on Monday if no improvement will refer to Neurology Afater 3-4 days of rest she may try the computer to how she tolerates if symptoms of HA occur to stop and                                  . Work note given, after 3-4 days of rest, she may try the computer for a limited time if HA occurs she is to stop and rest more. Educational material sent with patient. Patient verbalizes understanding and has no questions at discharge.

## 2020-06-20 ENCOUNTER — Other Ambulatory Visit: Payer: Self-pay

## 2020-06-20 ENCOUNTER — Ambulatory Visit: Payer: Self-pay | Admitting: Medical

## 2020-06-20 VITALS — BP 118/78 | HR 105 | Resp 16

## 2020-06-20 DIAGNOSIS — S060X0D Concussion without loss of consciousness, subsequent encounter: Secondary | ICD-10-CM

## 2020-06-20 NOTE — Progress Notes (Signed)
Subjective:    Patient ID: Christie [name], female    DOB: [birth year], 35 y.o.   MRN: [id number]  HPI  35 yo female in non acute distress for follow up concussion. She returned to work 2 days sooner because she has no time left to take..  She can tell when she has done too much, gets nauseous on and off balance problems and dull headache with visual changes " I see" floaters" in each eye. No brain diseases in the family. Completed Acute Concussion Evaluation form  This visit and visit from 06/15/2020 scanned into Media.  Blood pressure 118/78, pulse (!) 105, resp. rate 16, last menstrual period 06/12/2020, SpO2 97 %, unknown if currently breastfeeding.  Allergies  Allergen Reactions  . Bactrim [Sulfamethoxazole-Trimethoprim] Hives    Body hives and lip swelling   .  Review of Systems HA, nauseaa, no vomiting, No fatigue, sensitive to light and noise, difficulty concentrating and feeling slowed down, nervousness at times.    Objective:   Physical Exam Vitals and nursing note reviewed.  Constitutional:      Appearance: Normal appearance. She is normal weight.  HENT:     Head: Normocephalic and atraumatic.     Mouth/Throat:     Mouth: Mucous membranes are moist.     Tongue: Tongue does not deviate from midline.     Pharynx: Oropharynx is clear. Uvula midline.  Eyes:     Extraocular Movements: Extraocular movements intact.     Conjunctiva/sclera: Conjunctivae normal.     Pupils: Pupils are equal, round, and reactive to light.  Musculoskeletal:        General: Normal range of motion.     Cervical back: Normal range of motion.  Skin:    General: Skin is warm and dry.  Neurological:     General: No focal deficit present.     Mental Status: She is alert and oriented to person, place, and time.     GCS: GCS eye subscore is 4. GCS verbal subscore is 5. GCS motor subscore is 6.     Cranial Nerves: Cranial nerves are intact.     Sensory: Sensation is intact.     Motor: Motor  function is intact.     Coordination: Romberg sign negative. Coordination normal. Finger-Nose-Finger Test and Heel to [facility] Test normal.     Gait: Gait is intact. Gait normal.  Psychiatric:        Attention and Perception: Attention and perception normal.        Mood and Affect: Mood and affect normal.        Speech: Speech normal.        Behavior: Behavior normal. Behavior is cooperative.        Thought Content: Thought content normal.        Cognition and Memory: Cognition and memory normal.        Judgment: Judgment normal.           Assessment & Plan:  Concussion w/o LOC ACE form completed and scanned into media,  sympstoms last vist totaled 13 today 10 Rest as much as possible.  I recommended she talk to  HR to see if she could get FMLA for now. And possible donation of time office. Recommend she continue to be home and rest. Difficult for patient she has a  33 yo and  70 month old baby. She said she would try. I will give her a note for another week and then hopefully she will  get in to see Dr. Sherryll Burger at [place] [facility]. Patient verbalizes understanding and has no questions at discharge.

## 2020-06-27 DIAGNOSIS — R55 Syncope and collapse: Secondary | ICD-10-CM | POA: Diagnosis not present

## 2020-06-27 DIAGNOSIS — E559 Vitamin D deficiency, unspecified: Secondary | ICD-10-CM | POA: Diagnosis not present

## 2020-06-27 DIAGNOSIS — M5481 Occipital neuralgia: Secondary | ICD-10-CM | POA: Diagnosis not present

## 2020-06-27 DIAGNOSIS — Z87820 Personal history of traumatic brain injury: Secondary | ICD-10-CM | POA: Diagnosis not present

## 2020-08-01 ENCOUNTER — Other Ambulatory Visit: Payer: Self-pay

## 2020-08-01 ENCOUNTER — Ambulatory Visit: Payer: Self-pay | Admitting: Medical

## 2020-08-01 ENCOUNTER — Encounter: Payer: Self-pay | Admitting: Medical

## 2020-08-01 VITALS — BP 112/70 | HR 98 | Temp 98.3°F | Resp 16 | Wt 173.8 lb

## 2020-08-01 DIAGNOSIS — H6501 Acute serous otitis media, right ear: Secondary | ICD-10-CM

## 2020-08-01 DIAGNOSIS — H6983 Other specified disorders of Eustachian tube, bilateral: Secondary | ICD-10-CM

## 2020-08-01 DIAGNOSIS — Z20822 Contact with and (suspected) exposure to covid-19: Secondary | ICD-10-CM

## 2020-08-01 DIAGNOSIS — R519 Headache, unspecified: Secondary | ICD-10-CM

## 2020-08-01 DIAGNOSIS — J029 Acute pharyngitis, unspecified: Secondary | ICD-10-CM

## 2020-08-01 LAB — POC COVID19 BINAXNOW: SARS Coronavirus 2 Ag: NEGATIVE

## 2020-08-01 MED ORDER — AZITHROMYCIN 250 MG PO TABS: ORAL_TABLET | ORAL | 0 refills | Status: DC

## 2020-08-01 NOTE — Progress Notes (Signed)
Subjective:    Patient ID: Christie [name], female    DOB: 21-Oct-1985, 35 y.o.   MRN: [id number]  HPI 35 yo female in non acuate distress presents today with bilateral ear pain x  3 days for the right ear and 2 days for the left ear. Runny nose, headache and sore throat. Ferrer of  99.1 last night.      Has an 16 month old boy, not breast feeding.  Went to work today. Did Covid -19 test this am negative result. Review of Systems  Constitutional: Positive for chills and fever.  HENT: Positive for congestion, ear pain, rhinorrhea and sore throat.   Respiratory: Positive for cough.   Cardiovascular: Negative for chest pain.  Gastrointestinal: Negative for abdominal pain and diarrhea.  Musculoskeletal: Negative for myalgias.  Neurological: Positive for headaches. Negative for dizziness, syncope and light-headedness.       Objective:   Physical Exam Vitals and nursing note reviewed.  Constitutional:      Appearance: Normal appearance.  HENT:     Head: Normocephalic and atraumatic.     Right Ear: Ear canal and external ear normal. A middle ear effusion is present. Tympanic membrane is erythematous.     Left Ear: Ear canal and external ear normal. A middle ear effusion is present.     Nose: Congestion and rhinorrhea present. Rhinorrhea is clear.     Right Turbinates: Enlarged.     Mouth/Throat:     Lips: Pink.     Mouth: Mucous membranes are moist.     Pharynx: Uvula midline. Posterior oropharyngeal erythema (mild) present.     Tonsils: No tonsillar exudate or tonsillar abscesses. 0 on the right. 0 on the left.  Eyes:     Extraocular Movements: Extraocular movements intact.     Conjunctiva/sclera: Conjunctivae normal.     Pupils: Pupils are equal, round, and reactive to light.  Cardiovascular:     Rate and Rhythm: Normal rate and regular rhythm.     Heart sounds: Normal heart sounds.  Pulmonary:     Effort: Pulmonary effort is normal.     Breath sounds: Normal breath sounds.   Musculoskeletal:        General: Normal range of motion.     Cervical back: Normal range of motion and neck supple.  Lymphadenopathy:     Cervical: Cervical adenopathy present.  Skin:    General: Skin is warm and dry.  Neurological:     General: No focal deficit present.     Mental Status: She is alert and oriented to person, place, and time.  Psychiatric:        Mood and Affect: Mood normal.        Behavior: Behavior normal. Behavior is cooperative.        Thought Content: Thought content normal.        Judgment: Judgment normal.       Results for orders placed or performed in visit on 08/01/20 (from the past 24 hour(s))  POC COVID-19     Status: Normal   Collection Time: 08/01/20  2:37 PM  Result Value Ref Range   SARS Coronavirus 2 Ag Negative Negative      Assessment & Plan:  Otitis media right Eustachian tube dysfunction bilateral Pharyngitis Headache Meds ordered this encounter  Medications  . azithromycin (ZITHROMAX) 250 MG tablet    Sig: Take with food. Take 2 tablets on day 1, then 1 tablet daily on days 2 through 5  Dispense:  6 tablet    Refill:  0  rest, increase fluids, OTC Motrin or Tylenol per package instructions for fever or pain. Patient verbalizes understanding and has no questions at discharge.

## 2020-08-01 NOTE — Patient Instructions (Signed)
Eustachian Tube Dysfunction  Eustachian tube dysfunction refers to a condition in which a blockage develops in the narrow passage that connects the middle ear to the back of the nose (eustachian tube). The eustachian tube regulates air pressure in the middle ear by letting air move between the ear and nose. It also helps to drain fluid from the middle ear space. Eustachian tube dysfunction can affect one or both ears. When the eustachian tube does not function properly, air pressure, fluid, or both can build up in the middle ear. What are the causes? This condition occurs when the eustachian tube becomes blocked or cannot open normally. Common causes of this condition include:  Ear infections.  Colds and other infections that affect the nose, mouth, and throat (upper respiratory tract).  Allergies.  Irritation from cigarette smoke.  Irritation from stomach acid coming up into the esophagus (gastroesophageal reflux). The esophagus is the tube that carries food from the mouth to the stomach.  Sudden changes in air pressure, such as from descending in an airplane or scuba diving.  Abnormal growths in the nose or throat, such as: ? Growths that line the nose (nasal polyps). ? Abnormal growth of cells (tumors). ? Enlarged tissue at the back of the throat (adenoids). What increases the risk? You are more likely to develop this condition if:  You smoke.  You are overweight.  You are a child who has: ? Certain birth defects of the mouth, such as cleft palate. ? Large tonsils or adenoids. What are the signs or symptoms? Common symptoms of this condition include:  A feeling of fullness in the ear.  Ear pain.  Clicking or popping noises in the ear.  Ringing in the ear.  Hearing loss.  Loss of balance.  Dizziness. Symptoms may get worse when the air pressure around you changes, such as when you travel to an area of high elevation, fly on an airplane, or go scuba diving. How is  this diagnosed? This condition may be diagnosed based on:  Your symptoms.  A physical exam of your ears, nose, and throat.  Tests, such as those that measure: ? The movement of your eardrum (tympanogram). ? Your hearing (audiometry). How is this treated? Treatment depends on the cause and severity of your condition.  In mild cases, you may relieve your symptoms by moving air into your ears. This is called "popping the ears."  In more severe cases, or if you have symptoms of fluid in your ears, treatment may include: ? Medicines to relieve congestion (decongestants). ? Medicines that treat allergies (antihistamines). ? Nasal sprays or ear drops that contain medicines that reduce swelling (steroids). ? A procedure to drain the fluid in your eardrum (myringotomy). In this procedure, a small tube is placed in the eardrum to:  Drain the fluid.  Restore the air in the middle ear space. ? A procedure to insert a balloon device through the nose to inflate the opening of the eustachian tube (balloon dilation). Follow these instructions at home: Lifestyle  Do not do any of the following until your health care provider approves: ? Travel to high altitudes. ? Fly in airplanes. ? Work in a pressurized cabin or room. ? Scuba dive.  Do not use any products that contain nicotine or tobacco, such as cigarettes and e-cigarettes. If you need help quitting, ask your health care provider.  Keep your ears dry. Wear fitted earplugs during showering and bathing. Dry your ears completely after. General instructions  Take over-the-counter   and prescription medicines only as told by your health care provider.  Use techniques to help pop your ears as recommended by your health care provider. These may include: ? Chewing gum. ? Yawning. ? Frequent, forceful swallowing. ? Closing your mouth, holding your nose closed, and gently blowing as if you are trying to blow air out of your nose.  Keep all  follow-up visits as told by your health care provider. This is important. Contact a health care provider if:  Your symptoms do not go away after treatment.  Your symptoms come back after treatment.  You are unable to pop your ears.  You have: ? A fever. ? Pain in your ear. ? Pain in your head or neck. ? Fluid draining from your ear.  Your hearing suddenly changes.  You become very dizzy.  You lose your balance. Summary  Eustachian tube dysfunction refers to a condition in which a blockage develops in the eustachian tube.  It can be caused by ear infections, allergies, inhaled irritants, or abnormal growths in the nose or throat.  Symptoms include ear pain, hearing loss, or ringing in the ears.  Mild cases are treated with maneuvers to unblock the ears, such as yawning or ear popping.  Severe cases are treated with medicines. Surgery may also be done (rare). This information is not intended to replace advice given to you by your health care provider. Make sure you discuss any questions you have with your health care provider. Document Revised: 06/18/2017 Document Reviewed: 06/18/2017 Elsevier Patient Education  2021 Else[employer] Inc. Otitis Media, Adult  Otitis media is a condition in which the middle ear is red and swollen (inflamed) and full of fluid. The middle ear is the part of the ear that contains bones for hearing as well as air that helps send sounds to the brain. The condition usually goes away on its own. What are the causes? This condition is caused by a blockage in the eustachian tube. The eustachian tube connects the middle ear to the back of the nose. It normally allows air into the middle ear. The blockage is caused by fluid or swelling. Problems that can cause blockage include:  A cold or infection that affects the nose, mouth, or throat.  Allergies.  An irritant, such as tobacco smoke.  Adenoids that have become large. The adenoids are soft tissue located  in the back of the throat, behind the nose and the roof of the mouth.  Growth or swelling in the upper part of the throat, just behind the nose (nasopharynx).  Damage to the ear caused by change in pressure. This is called barotrauma. What are the signs or symptoms? Symptoms of this condition include:  Ear pain.  Fever.  Problems with hearing.  Being tired.  Fluid leaking from the ear.  Ringing in the ear. How is this treated? This condition can go away on its own within 3-5 days. But if the condition is caused by bacteria or does not go away on its own, or if it keeps coming back, your doctor may:  Give you antibiotic medicines.  Give you medicines for pain. Follow these instructions at home:  Take over-the-counter and prescription medicines only as told by your doctor.  If you were prescribed an antibiotic medicine, take it as told by your doctor. Do not stop taking the antibiotic even if you start to feel better.  Keep all follow-up visits as told by your doctor. This is important. Contact a doctor if:  You have bleeding from your nose.  There is a lump on your neck.  You are not feeling better in 5 days.  You feel worse instead of better. Get help right away if:  You have pain that is not helped with medicine.  You have swelling, redness, or pain around your ear.  You get a stiff neck.  You cannot move part of your face (paralysis).  You notice that the bone behind your ear hurts when you touch it.  You get a very bad headache. Summary  Otitis media means that the middle ear is red, swollen, and full of fluid.  This condition usually goes away on its own.  If the problem does not go away, treatment may be needed. You may be given medicines to treat the infection or to treat your pain.  If you were prescribed an antibiotic medicine, take it as told by your doctor. Do not stop taking the antibiotic even if you start to feel better.  Keep all follow-up  visits as told by your doctor. This is important. This information is not intended to replace advice given to you by your health care provider. Make sure you discuss any questions you have with your health care provider. Document Revised: 01/29/2019 Document Reviewed: 01/29/2019 Elsevier Patient Education  2021 [employer].

## 2020-08-02 MED ORDER — AZITHROMYCIN 250 MG PO TABS: ORAL_TABLET | ORAL | 0 refills | Status: AC

## 2020-08-02 NOTE — Addendum Note (Signed)
Addended by: Ellie Lunch R on: 08/02/2020 09:39 AM   Modules accepted: Orders

## 2020-08-17 DIAGNOSIS — N939 Abnormal uterine and vaginal bleeding, unspecified: Secondary | ICD-10-CM | POA: Diagnosis not present

## 2020-08-17 DIAGNOSIS — R102 Pelvic and perineal pain: Secondary | ICD-10-CM | POA: Diagnosis not present

## 2020-10-10 ENCOUNTER — Telehealth: Payer: Self-pay

## 2020-10-10 NOTE — Telephone Encounter (Signed)
Left vm for pt to callback 

## 2020-10-10 NOTE — Telephone Encounter (Signed)
Copied from CRM [id number]. Topic: Appointment Scheduling - Scheduling Inquiry for Clinic >> Oct 10, 2020  1:59 PM Traci Sermon wrote: Reason for CRM: Pt called in stating she was told she would be put with the new provider and wants to get scheduled with her. Please advise.

## 2020-10-10 NOTE — Telephone Encounter (Signed)
  Pt is calling Vernona Rieger back regarding scheduling with Elyse. She is wanting to discuss changing her Birth Control.

## 2020-10-11 NOTE — Telephone Encounter (Signed)
Appt made w/ Dr. Leonard Schwartz

## 2020-10-11 NOTE — Telephone Encounter (Signed)
Left another vm to call back.  Please transfer to the office so we can schedule pt

## 2020-10-19 DIAGNOSIS — F411 Generalized anxiety disorder: Secondary | ICD-10-CM | POA: Diagnosis not present

## 2020-10-21 ENCOUNTER — Encounter: Payer: Self-pay | Admitting: Family Medicine

## 2020-10-21 ENCOUNTER — Other Ambulatory Visit: Payer: Self-pay

## 2020-10-21 ENCOUNTER — Ambulatory Visit: Payer: BC Managed Care – PPO | Admitting: Family Medicine

## 2020-10-21 VITALS — BP 114/82 | HR 80 | Resp 16 | Ht 64.0 in | Wt 177.0 lb

## 2020-10-21 DIAGNOSIS — Z3009 Encounter for other general counseling and advice on contraception: Secondary | ICD-10-CM | POA: Diagnosis not present

## 2020-10-21 DIAGNOSIS — M7630 Iliotibial band syndrome, unspecified leg: Secondary | ICD-10-CM

## 2020-10-21 NOTE — Progress Notes (Signed)
Established patient visit   Patient: Christie [name]   DOB: 05-22-1985   35 y.o. Female  MRN: [id number] Visit Date: 10/21/2020  Today's healthcare provider: Shirlee Latch, MD   Chief Complaint  Patient presents with   Contraception   Subjective    HPI  She is here to discuss her current birth control, heavy menstrual bleeding and leg pain.  Birth Control/Heavy Bleeding  She started on her birth control in June. Prior to this she was having spotting without a normal period. Back in 2020 she had instances of heavy periods with severe cramping, bloating and migraines.   She reports that her most recent period was heavy with the same symptoms from before. She believes this may be associated with the new birth control. She denies having aura with her migraines   Indigestion/abdominal pain She is experiencing indigestion and abdominal pain. The pain radiates to her lower back.   Varicose Veins She has varicose veins along the IT band and at the end of the day she has a lot of pain. She reports that she does sit all day due to her job. She is concerned that it may be associated with lack of circulation.  Anxiety  She is seeing a therapist for her anxiety. She noticed that in times of high stress she is more aware of her pain.     Medications: Outpatient Medications Prior to Visit  Medication Sig   norgestimate-ethinyl estradiol (ORTHO-CYCLEN) 0.25-35 MG-MCG tablet    [DISCONTINUED] Prenatal Vit-Fe Fumarate-FA (PRENATAL MULTIVITAMIN) TABS tablet Take 1 tablet by mouth daily at 12 noon.   No facility-administered medications prior to visit.    Review of Systems  Constitutional:  Negative for appetite change, chills, fatigue and fever.  HENT:  Negative for ear pain, sinus pressure, sinus pain and sore throat.   Eyes:  Negative for pain and visual disturbance.  Respiratory:  Negative for cough, chest tightness, shortness of breath and wheezing.   Cardiovascular:  Negative  for chest pain, palpitations and leg swelling.  Gastrointestinal:  Negative for abdominal pain, blood in stool, diarrhea, nausea and vomiting.  Genitourinary:  Negative for dysuria, flank pain, frequency, pelvic pain and urgency.  Musculoskeletal:  Negative for back pain, myalgias and neck pain.  Neurological:  Negative for dizziness, weakness, light-headedness, numbness and headaches.      Objective    BP 114/82 (BP Location: Right Arm, Patient Position: Sitting, Cuff Size: Large)   Pulse 80   Resp 16   Ht 5\' 4"  (1.626 m)   Wt 177 lb (80.3 kg)   LMP 10/05/2020 (Approximate)   SpO2 98%   Breastfeeding No   BMI 30.38 kg/m     Physical Exam Vitals reviewed.  Constitutional:      General: She is not in acute distress.    Appearance: Normal appearance. She is well-developed. She is not diaphoretic.  HENT:     Head: Normocephalic and atraumatic.  Eyes:     General: No scleral icterus.    Conjunctiva/sclera: Conjunctivae normal.  Neck:     Thyroid: No thyromegaly.  Cardiovascular:     Rate and Rhythm: Normal rate and regular rhythm.     Pulses: Normal pulses.     Heart sounds: Normal heart sounds. No murmur heard. Pulmonary:     Effort: Pulmonary effort is normal. No respiratory distress.     Breath sounds: Normal breath sounds. No wheezing, rhonchi or rales.  Musculoskeletal:     Cervical back: Neck  supple.     Right lower leg: No edema.     Left lower leg: No edema.  Lymphadenopathy:     Cervical: No cervical adenopathy.  Skin:    General: Skin is warm and dry.     Findings: No rash.  Neurological:     Mental Status: She is alert and oriented to person, place, and time. Mental status is at baseline.  Psychiatric:        Mood and Affect: Mood normal.        Behavior: Behavior normal.     No results found for any visits on 10/21/20.  Assessment & Plan     1. Encounter for counseling regarding contraception - seems to be having ide effects from estrogen in  various OCPs that she has tried, including menstrual migraines without aura - had spotting on progesterone - discussed other options for contraception, including LARC - patient is interested in IUD - gave info and advised her to call her GYN for possible placement  2. Iliotibial band syndrome, unspecified laterality - tightness of b/l IT bands with occasional pain - NSAIDs prn - HEP given   Total time spent on today's visit was greater than 30 minutes, including both face-to-face time and nonface-to-face time personally spent on review of chart (labs and imaging), discussing labs and goals, discussing further work-up, treatment options, referrals to specialist if needed, reviewing outside records of pertinent, answering patient's questions, and coordinating care.    Return if symptoms worsen or fail to improve.      I,Essence Turner,acting as a Neurosurgeon for Shirlee Latch, MD.,have documented all relevant documentation on the behalf of Shirlee Latch, MD,as directed by  Shirlee Latch, MD while in the presence of Shirlee Latch, MD.  I, Shirlee Latch, MD, have reviewed all documentation for this visit. The documentation on 10/21/20 for the exam, diagnosis, procedures, and orders are all accurate and complete.   Sipriano Fendley, Marzella Schlein, MD, MPH [facility]

## 2020-10-24 ENCOUNTER — Ambulatory Visit: Payer: 59 | Admitting: Family Medicine

## 2020-10-31 DIAGNOSIS — F411 Generalized anxiety disorder: Secondary | ICD-10-CM | POA: Diagnosis not present

## 2020-11-15 DIAGNOSIS — F411 Generalized anxiety disorder: Secondary | ICD-10-CM | POA: Diagnosis not present

## 2020-11-16 ENCOUNTER — Telehealth: Payer: Self-pay

## 2020-11-16 NOTE — Telephone Encounter (Signed)
Patient wanted to be seen the end of Sept or early Oct. For a physical however Robynn Pane is booked until Dec. 9.     Patient stated she would call back at end of Sept to try to get on the new PAs scheduled for Oct.

## 2020-11-16 NOTE — Telephone Encounter (Signed)
Copied from CRM [id number]. Topic: Appointment Scheduling - Scheduling Inquiry for Clinic >> Nov 15, 2020  4:27 PM Randol Kern wrote: Reason for CRM: Pt wants to be scheduled for her CPE with [facility]. Prefers Friday mornings please advise  Best contact: [phone]

## 2020-12-02 DIAGNOSIS — Z23 Encounter for immunization: Secondary | ICD-10-CM | POA: Diagnosis not present

## 2020-12-07 ENCOUNTER — Other Ambulatory Visit: Payer: Self-pay | Admitting: Family Medicine

## 2020-12-07 DIAGNOSIS — N6489 Other specified disorders of breast: Secondary | ICD-10-CM

## 2020-12-09 ENCOUNTER — Telehealth: Payer: Self-pay

## 2020-12-09 NOTE — Telephone Encounter (Signed)
Copied from CRM [id number]. Topic: Appointment Scheduling - Scheduling Inquiry for Clinic >> Dec 08, 2020  3:28 PM Gaetana Michaelis A wrote: Reason for CRM: The patient would like to be seen by [facility]. Suzie Portela for possible IUD placement as well as to discuss menstrual discomfort (headaches and personal discomfort)   The patient shares that they've previously been referred to their OBGYN for placement but was unable to successfully schedule a visit   Please contact further when possible

## 2020-12-09 NOTE — Telephone Encounter (Signed)
Spoke with patient on the phone and advised her that we do not do IUD placements anymore until further notice from Dr. Beryle Flock. I offered to place referral for gyn for consult but patient declined stating that she already has a Ob in [place] and will reach out to them about IUD. KW

## 2020-12-13 DIAGNOSIS — F411 Generalized anxiety disorder: Secondary | ICD-10-CM | POA: Diagnosis not present

## 2020-12-15 ENCOUNTER — Other Ambulatory Visit: Payer: Self-pay

## 2020-12-15 ENCOUNTER — Ambulatory Visit
Admission: RE | Admit: 2020-12-15 | Discharge: 2020-12-15 | Disposition: A | Discharge status: Home / Self Care | Payer: BC Managed Care – PPO | Source: Ambulatory Visit | Attending: Family Medicine | Admitting: Family Medicine

## 2020-12-15 DIAGNOSIS — N6489 Other specified disorders of breast: Secondary | ICD-10-CM | POA: Insufficient documentation

## 2020-12-15 DIAGNOSIS — R922 Inconclusive mammogram: Secondary | ICD-10-CM | POA: Diagnosis not present

## 2020-12-19 DIAGNOSIS — Z309 Encounter for contraceptive management, unspecified: Secondary | ICD-10-CM | POA: Diagnosis not present

## 2020-12-21 ENCOUNTER — Other Ambulatory Visit: Payer: Self-pay

## 2020-12-21 ENCOUNTER — Ambulatory Visit: Payer: Self-pay

## 2020-12-21 DIAGNOSIS — Z23 Encounter for immunization: Secondary | ICD-10-CM

## 2020-12-30 DIAGNOSIS — Z30430 Encounter for insertion of intrauterine contraceptive device: Secondary | ICD-10-CM | POA: Diagnosis not present

## 2020-12-30 DIAGNOSIS — Z113 Encounter for screening for infections with a predominantly sexual mode of transmission: Secondary | ICD-10-CM | POA: Diagnosis not present

## 2020-12-30 DIAGNOSIS — Z3202 Encounter for pregnancy test, result negative: Secondary | ICD-10-CM | POA: Diagnosis not present

## 2021-01-02 DIAGNOSIS — F411 Generalized anxiety disorder: Secondary | ICD-10-CM | POA: Diagnosis not present

## 2021-01-31 DIAGNOSIS — F411 Generalized anxiety disorder: Secondary | ICD-10-CM | POA: Diagnosis not present

## 2021-02-15 ENCOUNTER — Ambulatory Visit: Payer: Self-pay | Admitting: Medical

## 2021-02-15 ENCOUNTER — Other Ambulatory Visit: Payer: Self-pay

## 2021-02-15 ENCOUNTER — Encounter: Payer: Self-pay | Admitting: Medical

## 2021-02-15 VITALS — BP 122/82 | HR 97 | Temp 98.0°F | Resp 16

## 2021-02-15 DIAGNOSIS — J0110 Acute frontal sinusitis, unspecified: Secondary | ICD-10-CM

## 2021-02-15 DIAGNOSIS — H1033 Unspecified acute conjunctivitis, bilateral: Secondary | ICD-10-CM

## 2021-02-15 MED ORDER — AMOXICILLIN 875 MG PO TABS: 875.0000 mg | ORAL_TABLET | Freq: Two times a day (BID) | ORAL | 0 refills | Status: AC

## 2021-02-15 MED ORDER — CIPROFLOXACIN HCL 0.3 % OP SOLN: 1.0000 [drp] | OPHTHALMIC | 0 refills | Status: DC

## 2021-02-15 NOTE — Progress Notes (Signed)
Subjective:    Patient ID: Christie [name], female    DOB: 1985/09/23, 35 y.o.   MRN: [id number]  HPI  35 yo female in non acute distress, presents today with laryngitis starting Monday. Started Friday with sore throat. Covid -19 test on Friday and it was negative. Denies Shortness of breath or chest pain. Has facial pressure and congestion in the forehead and nose.  Ears are painful mildly. Denies fever or chills.  Blood pressure 122/82, pulse 97, temperature 98 F (36.7 C), temperature source Tympanic, resp. rate 16, SpO2 100 %, not currently breastfeeding.   Allergies  Allergen Reactions   Bactrim [Sulfamethoxazole-Trimethoprim] Hives    Body hives and lip swelling   Taking nothing   Review of Systems  Constitutional:  Negative for chills and fever.  HENT:  Positive for congestion (green), ear pain (both), postnasal drip, rhinorrhea, sinus pressure, sinus pain (some), sore throat (Friday am) and voice change. Negative for tinnitus and trouble swallowing.   Eyes:  Positive for discharge (both and woke up with discharge.) and itching.  Respiratory:  Positive for cough (dry). Negative for chest tightness and wheezing.   Cardiovascular:  Negative for chest pain, palpitations and leg swelling.  Gastrointestinal:  Positive for diarrhea (Sunday ngiht) and nausea (sunday and monday). Negative for abdominal pain and vomiting.  Musculoskeletal:  Positive for neck pain (at lymph nodes). Negative for myalgias.  Skin:  Negative for color change.  Allergic/Immunologic: Positive for environmental allergies (mild usually with weather change).  Neurological:  Positive for headaches (mild per patient). Negative for dizziness, syncope and light-headedness.  Hematological:  Positive for adenopathy.   Pressure and pain at forehead, feels congested per patient.     Objective:   Physical Exam Constitutional:      Appearance: Normal appearance.  HENT:     Head: Normocephalic and atraumatic.      Right Ear: Hearing, ear canal and external ear normal. A middle ear effusion is present.     Left Ear: Hearing, ear canal and external ear normal. A middle ear effusion is present.     Mouth/Throat:     Lips: Pink.     Mouth: Mucous membranes are moist.     Pharynx: Uvula midline. Posterior oropharyngeal erythema (mild , no swelling , hoarse voice) present. No pharyngeal swelling, oropharyngeal exudate or uvula swelling.  Eyes:     General: Lids are normal. Vision grossly intact.        Right eye: Discharge present.        Left eye: No discharge.     Extraocular Movements: Extraocular movements intact.     Conjunctiva/sclera: Conjunctivae normal.     Pupils: Pupils are equal, round, and reactive to light.  Cardiovascular:     Rate and Rhythm: Normal rate and regular rhythm.     Heart sounds: No murmur heard.   No friction rub.  Pulmonary:     Effort: Pulmonary effort is normal.     Breath sounds: Normal breath sounds.  Musculoskeletal:        General: Normal range of motion.     Cervical back: Normal range of motion and neck supple.  Lymphadenopathy:     Cervical: Cervical adenopathy present.  Skin:    General: Skin is warm and dry.  Neurological:     General: No focal deficit present.     Mental Status: She is alert and oriented to person, place, and time. Mental status is at baseline.  Psychiatric:  Mood and Affect: Mood normal.        Behavior: Behavior normal.        Thought Content: Thought content normal.        Judgment: Judgment normal.     Pressure in ears.     Assessment & Plan:  Sinusitis Frontal  Conjunctivitis Eustachian tube dysfunction Start taking Zyrtec and Flonase as directed.  Meds ordered this encounter  Medications   amoxicillin (AMOXIL) 875 MG tablet    Sig: Take 1 tablet (875 mg total) by mouth 2 (two) times daily for 10 days.    Dispense:  20 tablet    Refill:  0   ciprofloxacin (CILOXAN) 0.3 % ophthalmic solution    Sig: Place 1  drop into both eyes every 2 (two) hours. Administer 1 drop, every 2 hours, while awake, for 2 days. Then 1 drop, every 4 hours, while awake, for the next 5 days.    Dispense:  5 mL    Refill:  0   Rest, increase fluids, over the counter Motrin or Tylenol per package instructions for pain and fever. Patient verbalizes understanding and has no questions at discharge.

## 2021-02-15 NOTE — Patient Instructions (Signed)
Zyrtec and Flonase per package instruction.     Sinusitis, Adult Sinusitis is soreness and swelling (inflammation) of your sinuses. Sinuses are hollow spaces in the bones around your face. They are located: Around your eyes. In the middle of your forehead. Behind your nose. In your cheekbones. Your sinuses and nasal passages are lined with a fluid called mucus. Mucus drains out of your sinuses. Swelling can trap mucus in your sinuses. This lets germs (bacteria, virus, or fungus) grow, which leads to infection. Most of the time, this condition is caused by a virus. What are the causes? This condition is caused by: Allergies. Asthma. Germs. Things that block your nose or sinuses. Growths in the nose (nasal polyps). Chemicals or irritants in the air. Fungus (rare). What increases the risk? You are more likely to develop this condition if: You have a weak body defense system (immune system). You do a lot of swimming or diving. You use nasal sprays too much. You smoke. What are the signs or symptoms? The main symptoms of this condition are pain and a feeling of pressure around the sinuses. Other symptoms include: Stuffy nose (congestion). Runny nose (drainage). Swelling and warmth in the sinuses. Headache. Toothache. A cough that may get worse at night. Mucus that collects in the throat or the back of the nose (postnasal drip). Being unable to smell and taste. Being very tired (fatigue). A fever. Sore throat. Bad breath. How is this diagnosed? This condition is diagnosed based on: Your symptoms. Your medical history. A physical exam. Tests to find out if your condition is short-term (acute) or long-term (chronic). Your doctor may: Check your nose for growths (polyps). Check your sinuses using a tool that has a light (endoscope). Check for allergies or germs. Do imaging tests, such as an MRI or CT scan. How is this treated? Treatment for this condition depends on the  cause and whether it is short-term or long-term. If caused by a virus, your symptoms should go away on their own within 10 days. You may be given medicines to relieve symptoms. They include: Medicines that shrink swollen tissue in the nose. Medicines that treat allergies (antihistamines). A spray that treats swelling of the nostrils.  Rinses that help get rid of thick mucus in your nose (nasal saline washes). If caused by bacteria, your doctor may wait to see if you will get better without treatment. You may be given antibiotic medicine if you have: A very bad infection. A weak body defense system. If caused by growths in the nose, you may need to have surgery. Follow these instructions at home: Medicines Take, use, or apply over-the-counter and prescription medicines only as told by your doctor. These may include nasal sprays. If you were prescribed an antibiotic medicine, take it as told by your doctor. Do not stop taking the antibiotic even if you start to feel better. Hydrate and humidify  Drink enough water to keep your pee (urine) pale yellow. Use a cool mist humidifier to keep the humidity level in your home above 50%. Breathe in steam for 10-15 minutes, 3-4 times a day, or as told by your doctor. You can do this in the bathroom while a hot shower is running. Try not to spend time in cool or dry air. Rest Rest as much as you can. Sleep with your head raised (elevated). Make sure you get enough sleep each night. General instructions  Put a warm, moist washcloth on your face 3-4 times a day, or as often  as told by your doctor. This will help with discomfort. Wash your hands often with soap and water. If there is no soap and water, use hand sanitizer. Do not smoke. Avoid being around people who are smoking (secondhand smoke). Keep all follow-up visits as told by your doctor. This is important. Contact a doctor if: You have a fever. Your symptoms get worse. Your symptoms do not  get better within 10 days. Get help right away if: You have a very bad headache. You cannot stop throwing up (vomiting). You have very bad pain or swelling around your face or eyes. You have trouble seeing. You feel confused. Your neck is stiff. You have trouble breathing. Summary Sinusitis is swelling of your sinuses. Sinuses are hollow spaces in the bones around your face. This condition is caused by tissues in your nose that become inflamed or swollen. This traps germs. These can lead to infection. If you were prescribed an antibiotic medicine, take it as told by your doctor. Do not stop taking it even if you start to feel better. Keep all follow-up visits as told by your doctor. This is important. This information is not intended to replace advice given to you by your health care provider. Make sure you discuss any questions you have with your health care provider. Document Revised: 07/29/2017 Document Reviewed: 07/29/2017 Elsevier Patient Education  2022 [employer] Bacterial Conjunctivitis, Adult Bacterial conjunctivitis is an infection of the clear membrane that covers the white part of the eye and the inner surface of the eyelid (conjunctiva). When the blood vessels in the conjunctiva become inflamed, the eye becomes red or pink. The eye often feels irritated or itchy. Bacterial conjunctivitis spreads easily from person to person (is contagious). It also spreads easily from one eye to the other eye. What are the causes? This condition is caused by bacteria. You may get the infection if you come into close contact with: A person who is infected with the bacteria. Items that are contaminated with the bacteria, such as a face towel, contact lens solution, or eye makeup. What increases the risk? You are more likely to develop this condition if: You are exposed to other people who have the infection. You wear contact lenses. You have a sinus infection. You have had a recent eye  injury or surgery. You have a weak body defense system (immune system). You have a medical condition that causes dry eyes. What are the signs or symptoms? Symptoms of this condition include: Thick, yellowish discharge from the eye. This may turn into a crust on the eyelid overnight and cause your eyelids to stick together. Tearing or watery eyes. Itchy eyes. Burning feeling in your eyes. Eye redness. Swollen eyelids. Blurred vision. How is this diagnosed? This condition is diagnosed based on your symptoms and medical history. Your health care provider may also take a sample of discharge from your eye to find the cause of your infection. How is this treated? This condition may be treated with: Antibiotic eye drops or ointment to clear the infection more quickly and prevent the spread of infection to others. Antibiotic medicines taken by mouth (orally) to treat infections that do not respond to drops or ointments or that last longer than 10 days. Cool, wet cloths (cool compresses) placed on the eyes. Artificial tears applied 2-6 times a day. Follow these instructions at home: Medicines Take or apply your antibiotic medicine as told by your health care provider. Do not stop using the antibiotic, even if your  condition improves, unless directed by your health care provider. Take or apply over-the-counter and prescription medicines only as told by your health care provider. Be very careful to avoid touching the edge of your eyelid with the eye-drop bottle or the ointment tube when you apply medicines to the affected eye. This will keep you from spreading the infection to your other eye or to other people. Managing discomfort Gently wipe away any drainage from your eye with a warm, wet washcloth or a cotton ball. Apply a clean, cool compress to your eye for 10-20 minutes, 3-4 times a day. General instructions Do not wear contact lenses until the inflammation is gone and your health care  provider says it is safe to wear them again. Ask your health care provider how to sterilize or replace your contact lenses before you use them again. Wear glasses until you can resume wearing contact lenses. Avoid wearing eye makeup until the inflammation is gone. Throw away any old eye cosmetics that may be contaminated. Change or wash your pillowcase every day. Do not share towels or washcloths. This may spread the infection. Wash your hands often with soap and water for at least 20 seconds and especially before touching your face or eyes. Use paper towels to dry your hands. Avoid touching or rubbing your eyes. Do not drive or use heavy machinery if your vision is blurred. Contact a health care provider if: You have a fever. Your symptoms do not get better after 10 days. Get help right away if: You have a fever and your symptoms suddenly get worse. You have severe pain when you move your eye. You have facial pain, redness, or swelling. You have a sudden loss of vision. Summary Bacterial conjunctivitis is an infection of the clear membrane that covers the white part of the eye and the inner surface of the eyelid (conjunctiva). Bacterial conjunctivitis spreads easily from eye to eye and from person to person (is contagious). Wash your hands often with soap and water for at least 20 seconds and especially before touching your face or eyes. Use paper towels to dry your hands. Take or apply your antibiotic medicine as told by your health care provider. Do not stop using the antibiotic even if your condition improves. Contact a health care provider if you have a fever or if your symptoms do not get better after 10 days. Get help right away if you have a sudden loss of vision. This information is not intended to replace advice given to you by your health care provider. Make sure you discuss any questions you have with your health care provider. Document Revised: 06/08/2020 Document Reviewed:  06/08/2020 Elsevier Patient Education  2022 [employer].

## 2021-02-22 DIAGNOSIS — Z6829 Body mass index (BMI) 29.0-29.9, adult: Secondary | ICD-10-CM | POA: Diagnosis not present

## 2021-02-22 DIAGNOSIS — Z01419 Encounter for gynecological examination (general) (routine) without abnormal findings: Secondary | ICD-10-CM | POA: Diagnosis not present

## 2021-02-22 DIAGNOSIS — R102 Pelvic and perineal pain: Secondary | ICD-10-CM | POA: Diagnosis not present

## 2021-02-22 DIAGNOSIS — Z30431 Encounter for routine checking of intrauterine contraceptive device: Secondary | ICD-10-CM | POA: Diagnosis not present

## 2021-02-28 DIAGNOSIS — F411 Generalized anxiety disorder: Secondary | ICD-10-CM | POA: Diagnosis not present

## 2021-03-09 ENCOUNTER — Encounter: Payer: Self-pay | Admitting: Nurse Practitioner

## 2021-03-14 ENCOUNTER — Other Ambulatory Visit: Payer: Self-pay

## 2021-03-23 ENCOUNTER — Encounter: Payer: Self-pay | Admitting: Physician Assistant

## 2021-03-23 ENCOUNTER — Other Ambulatory Visit: Payer: Self-pay

## 2021-03-23 ENCOUNTER — Ambulatory Visit (INDEPENDENT_AMBULATORY_CARE_PROVIDER_SITE_OTHER): Payer: BC Managed Care – PPO | Admitting: Physician Assistant

## 2021-03-23 VITALS — BP 123/79 | HR 75 | Temp 98.0°F | Ht 63.0 in | Wt 176.0 lb

## 2021-03-23 DIAGNOSIS — K5901 Slow transit constipation: Secondary | ICD-10-CM | POA: Diagnosis not present

## 2021-03-23 DIAGNOSIS — Z0000 Encounter for general adult medical examination without abnormal findings: Secondary | ICD-10-CM | POA: Diagnosis not present

## 2021-03-23 DIAGNOSIS — H66002 Acute suppurative otitis media without spontaneous rupture of ear drum, left ear: Secondary | ICD-10-CM | POA: Insufficient documentation

## 2021-03-23 DIAGNOSIS — Z6831 Body mass index (BMI) 31.0-31.9, adult: Secondary | ICD-10-CM

## 2021-03-23 DIAGNOSIS — Z1159 Encounter for screening for other viral diseases: Secondary | ICD-10-CM | POA: Diagnosis not present

## 2021-03-23 DIAGNOSIS — K219 Gastro-esophageal reflux disease without esophagitis: Secondary | ICD-10-CM

## 2021-03-23 DIAGNOSIS — F411 Generalized anxiety disorder: Secondary | ICD-10-CM | POA: Diagnosis not present

## 2021-03-23 MED ORDER — AMOXICILLIN 875 MG PO TABS: 875.0000 mg | ORAL_TABLET | Freq: Two times a day (BID) | ORAL | 0 refills | Status: AC

## 2021-03-23 MED ORDER — OMEPRAZOLE 40 MG PO CPDR: 40.0000 mg | DELAYED_RELEASE_CAPSULE | Freq: Every day | ORAL | 0 refills | Status: DC

## 2021-03-23 NOTE — Progress Notes (Signed)
I,Christie [name],acting as a Neurosurgeon for [employer], PA-C.,have documented all relevant documentation on the behalf of Christie Ferguson, PA-C,as directed by  Christie Ferguson, PA-C while in the presence of Christie Ferguson, PA-C.  Complete physical exam   Patient: Christie [name]   DOB: 1985-10-02   36 y.o. Female  MRN: [id number] Visit Date: 03/23/2021  Today's healthcare provider: [name], PA-C   Cc. CPE  Subjective    Christie [name] is a 36 y.o. female who presents today for a complete physical exam.  She does have some additional problems to discuss today.  She has been feeling L ear pain and L sided sinus pressure since yesterday, has taken ibuprofen for pain. Denies fevers, chills, headaches.  She is primarily concerned over ongoing left lower quadrant abdominal pain, bloating, constipation, gas, nausea. Deals with lower abdominal pressure, 'like my uterus is going to fall out'. Her symptoms come and go. She had a w/u with her GYN, per pt negative vaginal exam and negative pelvic ultrasound.  She currently has an IUD in, placed in October 2022 and is G2P2 last birth 12/2019.   Denies blood in stool, vomiting. No history of cholecystectomy.  men Past Medical History:  Diagnosis Date   GERD (gastroesophageal reflux disease)    History of gestational hypertension    Pregnancy 01/17/2017   Pregnancy induced hypertension    Past Surgical History:  Procedure Laterality Date   NO PAST SURGERIES     WISDOM TOOTH EXTRACTION     Social History   Socioeconomic History   Marital status: Married    Spouse name: Christie [name]   Number of children: 2   Years of education: Not on file   Highest education level: Not on file  Occupational History   Not on file  Tobacco Use   Smoking status: Never   Smokeless tobacco: Never  Vaping Use   Vaping Use: Never used  Substance and Sexual Activity   Alcohol use: No    Alcohol/week: 0.0 standard drinks   Drug use: No   Sexual  activity: Yes  Other Topics Concern   Not on file  Social History Narrative   Not on file   Social Determinants of Health   Financial Resource Strain: Not on file  Food Insecurity: Not on file  Transportation Needs: Not on file  Physical Activity: Not on file  Stress: Not on file  Social Connections: Not on file  Intimate Partner Violence: Not on file   Family Status  Relation Name Status   Mother  Alive   Father  Alive   Neg Hx  (Not Specified)   Family History  Problem Relation Age of Onset   Melanoma Mother    Thyroid disease Mother    Hypertension Mother    Breast cancer Neg Hx    Allergies  Allergen Reactions   Bactrim [Sulfamethoxazole-Trimethoprim] Hives    Body hives and lip swelling    Patient Care Team: [name], FNP as PCP - General (Family Medicine)   Medications: Outpatient Medications Prior to Visit  Medication Sig   ciprofloxacin (CILOXAN) 0.3 % ophthalmic solution Place 1 drop into both eyes every 2 (two) hours. Administer 1 drop, every 2 hours, while awake, for 2 days. Then 1 drop, every 4 hours, while awake, for the next 5 days.   norgestimate-ethinyl estradiol (ORTHO-CYCLEN) 0.25-35 MG-MCG tablet    No facility-administered medications prior to visit.    Review of Systems  Constitutional: Negative.  HENT:  Positive for ear pain and sinus pressure.   Eyes: Negative.   Cardiovascular: Negative.   Gastrointestinal:  Positive for abdominal distention, abdominal pain, constipation and nausea.  Endocrine: Negative.   Genitourinary:  Positive for vaginal discharge.  Musculoskeletal: Negative.   Skin: Negative.   Allergic/Immunologic: Negative.   Neurological:  Positive for headaches.  Hematological: Negative.   Psychiatric/Behavioral:  The patient is nervous/anxious.   All other systems reviewed and are negative.    Objective    Blood pressure 123/79, pulse 75, temperature 98 F (36.7 C), temperature source Oral, height 5\' 3"  (1.6  m), weight 176 lb (79.8 kg), SpO2 98 %, not currently breastfeeding.   Physical Exam Constitutional:      General: She is awake.     Appearance: She is well-developed.  HENT:     Head: Normocephalic.     Right Ear: Tympanic membrane normal.     Left Ear: Tympanic membrane is erythematous and bulging.  Eyes:     Conjunctiva/sclera: Conjunctivae normal.     Pupils: Pupils are equal, round, and reactive to light.  Neck:     Thyroid: No thyroid mass or thyromegaly.  Cardiovascular:     Rate and Rhythm: Normal rate and regular rhythm.     Heart sounds: Normal heart sounds.  Pulmonary:     Effort: Pulmonary effort is normal.     Breath sounds: Normal breath sounds.  Abdominal:     Palpations: Abdomen is soft.     Tenderness: There is abdominal tenderness in the right lower quadrant. There is no guarding or rebound. Negative signs include Murphy's sign and McBurney's sign.     Comments: Mild tenderness RLQ.   Musculoskeletal:     Right lower leg: No swelling.     Left lower leg: No swelling.  Lymphadenopathy:     Cervical: No cervical adenopathy.  Skin:    General: Skin is warm.  Neurological:     Mental Status: She is alert and oriented to person, place, and time.  Psychiatric:        Attention and Perception: Attention normal.        Mood and Affect: Mood normal.        Speech: Speech normal.        Behavior: Behavior is cooperative.     Last depression screening scores PHQ 2/9 Scores 03/23/2021 10/21/2020 04/23/2018  PHQ - 2 Score 0 0 0  PHQ- 9 Score 3 3 -   Last fall risk screening Fall Risk  03/23/2021  Falls in the past year? 1  Number falls in past yr: 0  Injury with Fall? 1  Risk for fall due to : Other (Comment)  Risk for fall due to: Comment been sick with no appetite and passed out   Last Audit-C alcohol use screening Alcohol Use Disorder Test (AUDIT) 03/23/2021  1. How often do you have a drink containing alcohol? 1  2. How many drinks containing alcohol do  you have on a typical day when you are drinking? 0  3. How often do you have six or more drinks on one occasion? 0  AUDIT-C Score 1  Alcohol Brief Interventions/Follow-up -   A score of 3 or more in women, and 4 or more in men indicates increased risk for alcohol abuse, EXCEPT if all of the points are from question 1   No results found for any visits on 03/23/21.  Assessment & Plan    Routine Health Maintenance and Physical  Exam   Immunization History  Administered Date(s) Administered   Influenza,inj,Quad PF,6+ Mos 12/26/2018, 12/21/2020   Moderna Sars-Covid-2 Vaccination 03/24/2019, 04/21/2019   Tdap 10/23/2016    Health Maintenance  Topic Date Due   Hepatitis C Screening  Never done   COVID-19 Vaccine (3 - Booster for Moderna series) 06/16/2019   PAP SMEAR-Modifier  04/23/2021   TETANUS/TDAP  10/27/2029   INFLUENZA VACCINE  Completed   HIV Screening  Completed   Pneumococcal Vaccine 919-34 Years old  Aged Out   HPV VACCINES  Aged Out    Discussed health benefits of physical activity, and encouraged her to engage in regular exercise appropriate for her age and condition.  Problem List Items Addressed This Visit       Digestive   GERD (gastroesophageal reflux disease)    Historically, pt does not feel she is having a flare, but having other GI symptoms. --hpylori breath test, start omeprazole in AM before breakfast. If hpylori negative, will ref to GI. See constipation note as well       Relevant Medications   omeprazole (PRILOSEC) 40 MG capsule   Other Relevant Orders   H. pylori breath test   Slow transit constipation    Advised to continue miralax, even twice a day. Increase dietary fiber intake.        Relevant Orders   TSH + free T4     Nervous and Auditory   Non-recurrent acute suppurative otitis media of left ear without spontaneous rupture of tympanic membrane    Left ear bulging, with a small amount of erythema. Advised pt to use flonase twice a day,  saline nasal sprays. If not improved, ok to take Amoxicillin.  To note pt has large stomach upset with augmentin.       Relevant Medications   amoxicillin (AMOXIL) 875 MG tablet   Other Visit Diagnoses     Encounter for physical examination    -  Primary   Encounter for hepatitis C screening test for low risk patient       Relevant Orders   Hepatitis C antibody   BMI 31.0-31.9,adult       Relevant Orders   Comprehensive Metabolic Panel (CMET)   Lipid Profile        Return in about 3 months (around 06/21/2021) for constipation.     I, Christie FergusonLindsay Morganne Haile, PA-C have reviewed all documentation for this visit. The documentation on  03/23/2021 for the exam, diagnosis, procedures, and orders are all accurate and complete.    Christie FergusonLindsay Chancey Ringel, PA-C  [facility] [phone] (phone) [phone] (fax)  [facility]

## 2021-03-23 NOTE — Assessment & Plan Note (Signed)
Historically, pt does not feel she is having a flare, but having other GI symptoms. --hpylori breath test, start omeprazole in AM before breakfast. If hpylori negative, will ref to GI. See constipation note as well

## 2021-03-23 NOTE — Patient Instructions (Signed)
Preventive Care 21-34 Years Old, Female °Preventive care refers to lifestyle choices and visits with your health care provider that can promote health and wellness. Preventive care visits are also called wellness exams. °What can I expect for my preventive care visit? °Counseling °During your preventive care visit, your health care provider may ask about your: °Medical history, including: °Past medical problems. °Family medical history. °Pregnancy history. °Current health, including: °Menstrual cycle. °Method of birth control. °Emotional well-being. °Home life and relationship well-being. °Sexual activity and sexual health. °Lifestyle, including: °Alcohol, nicotine or tobacco, and drug use. °Access to firearms. °Diet, exercise, and sleep habits. °Work and work environment. °Sunscreen use. °Safety issues such as seatbelt and bike helmet use. °Physical exam °Your health care provider may check your: °Height and weight. These may be used to calculate your BMI (body mass index). BMI is a measurement that tells if you are at a healthy weight. °Waist circumference. This measures the distance around your waistline. This measurement also tells if you are at a healthy weight and may help predict your risk of certain diseases, such as type 2 diabetes and high blood pressure. °Heart rate and blood pressure. °Body temperature. °Skin for abnormal spots. °What immunizations do I need? °Vaccines are usually given at various ages, according to a schedule. Your health care provider will recommend vaccines for you based on your age, medical history, and lifestyle or other factors, such as travel or where you work. °What tests do I need? °Screening °Your health care provider may recommend screening tests for certain conditions. This may include: °Pelvic exam and Pap test. °Lipid and cholesterol levels. °Diabetes screening. This is done by checking your blood sugar (glucose) after you have not eaten for a while (fasting). °Hepatitis B  test. °Hepatitis C test. °HIV (human immunodeficiency virus) test. °STI (sexually transmitted infection) testing, if you are at risk. °BRCA-related cancer screening. This may be done if you have a family history of breast, ovarian, tubal, or peritoneal cancers. °Talk with your health care provider about your test results, treatment options, and if necessary, the need for more tests. °Follow these instructions at home: °Eating and drinking ° °Eat a healthy diet that includes fresh fruits and vegetables, whole grains, lean protein, and low-fat dairy products. °Take vitamin and mineral supplements as recommended by your health care provider. °Do not drink alcohol if: °Your health care provider tells you not to drink. °You are pregnant, may be pregnant, or are planning to become pregnant. °If you drink alcohol: °Limit how much you have to 0-1 drink a day. °Know how much alcohol is in your drink. In the [place] one drink equals one 12 oz bottle of beer (355 mL), one 5 oz glass of wine (148 mL), or one 1½ oz glass of hard liquor (44 mL). °Lifestyle °Brush your teeth every morning and night with fluoride toothpaste. Floss one time each day. °Exercise for at least 30 minutes 5 or more days each week. °Do not use any products that contain nicotine or tobacco. These products include cigarettes, chewing tobacco, and vaping devices, such as e-cigarettes. If you need help quitting, ask your health care provider. °Do not use drugs. °If you are sexually active, practice safe sex. Use a condom or other form of protection to prevent STIs. °If you do not wish to become pregnant, use a form of birth control. If you plan to become pregnant, see your health care provider for a prepregnancy visit. °Find healthy ways to manage stress, such as: °Meditation, yoga,   or listening to music. °Journaling. °Talking to a trusted person. °Spending time with friends and family. °Minimize exposure to UV radiation to reduce your risk of skin  cancer. °Safety °Always wear your seat belt while driving or riding in a vehicle. °Do not drive: °If you have been drinking alcohol. Do not ride with someone who has been drinking. °If you have been using any mind-altering substances or drugs. °While texting. °When you are tired or distracted. °Wear a helmet and other protective equipment during sports activities. °If you have firearms in your house, make sure you follow all gun safety procedures. °Seek help if you have been physically or sexually abused. °What's next? °Go to your health care provider once a year for an annual wellness visit. °Ask your health care provider how often you should have your eyes and teeth checked. °Stay up to date on all vaccines. °This information is not intended to replace advice given to you by your health care provider. Make sure you discuss any questions you have with your health care provider. °Document Revised: 08/24/2020 Document Reviewed: 08/24/2020 °Elsevier Patient Education © 2022 [employer] ° °

## 2021-03-23 NOTE — Assessment & Plan Note (Signed)
Advised to continue miralax, even twice a day. Increase dietary fiber intake.

## 2021-03-23 NOTE — Assessment & Plan Note (Signed)
Left ear bulging, with a small amount of erythema. Advised pt to use flonase twice a day, saline nasal sprays. If not improved, ok to take Amoxicillin.  To note pt has large stomach upset with augmentin.

## 2021-03-24 LAB — H. PYLORI BREATH TEST: H pylori Breath Test: NEGATIVE

## 2021-04-24 ENCOUNTER — Telehealth: Payer: Self-pay

## 2021-04-24 NOTE — Telephone Encounter (Signed)
Copied from [place] [id number]. Topic: Referral - Status >> Apr 24, 2021  4:41 PM Christie [name], [place] C wrote: Reason for CRM: pt says that she was suppose to receive a call from GI for a referral. Pt says that she hasn't received a call yet and would like to check the status?   Please assist pt further.  [phone]

## 2021-04-25 NOTE — Telephone Encounter (Signed)
Patient advised and given number she was to call them back.

## 2021-05-01 DIAGNOSIS — F411 Generalized anxiety disorder: Secondary | ICD-10-CM | POA: Diagnosis not present

## 2021-05-30 DIAGNOSIS — F411 Generalized anxiety disorder: Secondary | ICD-10-CM | POA: Diagnosis not present

## 2021-06-26 DIAGNOSIS — F411 Generalized anxiety disorder: Secondary | ICD-10-CM | POA: Diagnosis not present

## 2021-06-27 ENCOUNTER — Encounter: Payer: Self-pay | Admitting: Physician Assistant

## 2021-06-27 ENCOUNTER — Ambulatory Visit: Payer: BC Managed Care – PPO | Admitting: Physician Assistant

## 2021-06-27 VITALS — BP 118/85 | HR 81 | Temp 98.0°F | Ht 64.0 in | Wt 176.6 lb

## 2021-06-27 DIAGNOSIS — K5901 Slow transit constipation: Secondary | ICD-10-CM | POA: Diagnosis not present

## 2021-06-27 DIAGNOSIS — Z1159 Encounter for screening for other viral diseases: Secondary | ICD-10-CM

## 2021-06-27 DIAGNOSIS — Z6831 Body mass index (BMI) 31.0-31.9, adult: Secondary | ICD-10-CM | POA: Diagnosis not present

## 2021-06-27 DIAGNOSIS — R1084 Generalized abdominal pain: Secondary | ICD-10-CM | POA: Insufficient documentation

## 2021-06-27 NOTE — Assessment & Plan Note (Signed)
IBS? Advised she call GI for an earlier appt ?Advised she keep a food journal to track symptoms with what she eats ?May have a component of pelvic muscle weakness resulting in hip tightness. Advised stretching/home pt, can find resources online ?

## 2021-06-27 NOTE — Progress Notes (Signed)
?  ?I,Sha'taria Tyson,acting as a Neurosurgeon for [employer], Christie [name].,have documented all relevant documentation on the behalf of Christie Ferguson, Christie [name],as directed by  Christie Ferguson, Christie [name] while in the presence of Christie Ferguson, Christie [name]. ? ? ?Established patient visit ? ? ?Patient: Christie [name]   DOB: 04/15/1985   36 y.o. Female  MRN: [id number] ?Visit Date: 06/27/2021 ? ?Today's healthcare provider: [name], Christie [name]  ? ?Cc. GI pain f/u ? ?Subjective  ?  ?HPI  ? ?Christie [name] is a 36 yo female who presents today to follow up on abdominal pain and constipation. She was started on omeprazole. Denies any improvement or change in symptoms with omeprazole. ?Reports pain is primarily in her lower abdomen, but overall feels bloated, nauseous, intermittent both left and right lower abdominal pain, and occasional RUQ abdominal pain. ?She reports not being contacted by GI but does see an appt in her chart in July.  ?Last bowel movement was yesterday, normal per pt. Patient's last menstrual period was 06/19/2021.  ? ?She also reports front of hip 'hip flexor' tightness that improves with stretching.  ? ? ? ?Medications: ?Outpatient Medications Prior to Visit  ?Medication Sig  ? ciprofloxacin (CILOXAN) 0.3 % ophthalmic solution Place 1 drop into both eyes every 2 (two) hours. Administer 1 drop, every 2 hours, while awake, for 2 days. Then 1 drop, every 4 hours, while awake, for the next 5 days.  ? norgestimate-ethinyl estradiol (ORTHO-CYCLEN) 0.25-35 MG-MCG tablet   ? omeprazole (PRILOSEC) 40 MG capsule Take 1 capsule (40 mg total) by mouth daily.  ? ?No facility-administered medications prior to visit.  ? ? ?Review of Systems  ?Constitutional:  Negative for fatigue and fever.  ?Respiratory:  Negative for cough and shortness of breath.   ?Cardiovascular:  Negative for chest pain and leg swelling.  ?Gastrointestinal:  Positive for abdominal distention, abdominal pain and nausea.  ?Neurological:  Negative for dizziness and  headaches.  ? ? ?  Objective  ?  ?Blood pressure 118/85, pulse 81, temperature 98 ?F (36.7 ?C), temperature source Oral, height 5\' 4"  (1.626 m), weight 176 lb 9.6 oz (80.1 kg), last menstrual period 06/19/2021, SpO2 99 %, not currently breastfeeding.  ? ?Physical Exam ?Constitutional:   ?   General: She is awake.  ?   Appearance: She is well-developed.  ?HENT:  ?   Head: Normocephalic.  ?Eyes:  ?   Conjunctiva/sclera: Conjunctivae normal.  ?Cardiovascular:  ?   Rate and Rhythm: Normal rate.  ?Pulmonary:  ?   Effort: Pulmonary effort is normal.  ?Abdominal:  ?   General: Abdomen is flat. There is no distension.  ?   Palpations: Abdomen is soft.  ?   Tenderness: There is no abdominal tenderness. Negative signs include psoas sign and obturator sign.  ?Musculoskeletal:  ?   Comments: Full ROM B/l hips without pain  ?Skin: ?   General: Skin is warm.  ?Neurological:  ?   Mental Status: She is alert and oriented to person, place, and time.  ?Psychiatric:     ?   Attention and Perception: Attention normal.     ?   Mood and Affect: Mood normal.     ?   Speech: Speech normal.     ?   Behavior: Behavior is cooperative.  ?  ? ?No results found for any visits on 06/27/21. ? Assessment & Plan  ?  ? ?Problem List Items Addressed This Visit   ? ?  ? Digestive  ? Slow transit  constipation  ?  Dx pulled again to release labs as she did not completed last visit ? ?  ?  ?  ? Other  ? Generalized abdominal pain - Primary  ?  IBS? Advised she call GI for an earlier appt ?Advised she keep a food journal to track symptoms with what she eats ?May have a component of pelvic muscle weakness resulting in hip tightness. Advised stretching/home pt, can find resources online ? ?  ?  ? ?Other Visit Diagnoses   ? ? Encounter for hepatitis C screening test for low risk patient      ? BMI 31.0-31.9,adult      ? ?  ? Other dx pulled for release of labs she did not completed from last visit ?I, Christie Ferguson, Christie [name] have reviewed all documentation for  this visit. The documentation on  06/27/2021  for the exam, diagnosis, procedures, and orders are all accurate and complete. ? ?Christie Ferguson, Christie [name] ?[facility] ?[address] ?[place], [place], [address] ?Office: [phone] ?Fax: [phone]  ? ?[facility]  ?

## 2021-06-27 NOTE — Assessment & Plan Note (Signed)
Dx pulled again to release labs as she did not completed last visit ?

## 2021-06-28 ENCOUNTER — Other Ambulatory Visit: Payer: Self-pay | Admitting: Physician Assistant

## 2021-06-28 ENCOUNTER — Encounter: Payer: Self-pay | Admitting: Physician Assistant

## 2021-06-28 DIAGNOSIS — M6289 Other specified disorders of muscle: Secondary | ICD-10-CM

## 2021-06-28 LAB — COMPREHENSIVE METABOLIC PANEL
ALT: 20 IU/L (ref 0–32)
AST: 22 IU/L (ref 0–40)
Albumin/Globulin Ratio: 1.7 (ref 1.2–2.2)
Albumin: 4.8 g/dL (ref 3.8–4.8)
Alkaline Phosphatase: 75 IU/L (ref 44–121)
BUN/Creatinine Ratio: 17 (ref 9–23)
BUN: 15 mg/dL (ref 6–20)
Bilirubin Total: 0.4 mg/dL (ref 0.0–1.2)
CO2: 22 mmol/L (ref 20–29)
Calcium: 9.6 mg/dL (ref 8.7–10.2)
Chloride: 103 mmol/L (ref 96–106)
Creatinine, Ser: 0.87 mg/dL (ref 0.57–1.00)
Globulin, Total: 2.8 g/dL (ref 1.5–4.5)
Glucose: 96 mg/dL (ref 70–99)
Potassium: 4.5 mmol/L (ref 3.5–5.2)
Sodium: 141 mmol/L (ref 134–144)
Total Protein: 7.6 g/dL (ref 6.0–8.5)
eGFR: 90 mL/min/{1.73_m2} (ref >59)

## 2021-06-28 LAB — LIPID PANEL
Chol/HDL Ratio: 5 ratio — ABNORMAL HIGH (ref 0.0–4.4)
Cholesterol, Total: 181 mg/dL (ref 100–199)
HDL: 36 mg/dL — ABNORMAL LOW (ref >39)
LDL Chol Calc (NIH): 120 mg/dL — ABNORMAL HIGH (ref 0–99)
Triglycerides: 138 mg/dL (ref 0–149)
VLDL Cholesterol Cal: 25 mg/dL (ref 5–40)

## 2021-06-28 LAB — HEPATITIS C ANTIBODY: Hep C Virus Ab: NONREACTIVE

## 2021-06-28 LAB — TSH+FREE T4
Free T4: 1.14 ng/dL (ref 0.82–1.77)
TSH: 2.33 u[IU]/mL (ref 0.450–4.500)

## 2021-06-28 NOTE — Telephone Encounter (Signed)
I think I pulled the right referral for you.? ?

## 2021-07-11 DIAGNOSIS — D2272 Melanocytic nevi of left lower limb, including hip: Secondary | ICD-10-CM | POA: Diagnosis not present

## 2021-07-11 DIAGNOSIS — D2262 Melanocytic nevi of left upper limb, including shoulder: Secondary | ICD-10-CM | POA: Diagnosis not present

## 2021-07-11 DIAGNOSIS — D225 Melanocytic nevi of trunk: Secondary | ICD-10-CM | POA: Diagnosis not present

## 2021-07-11 DIAGNOSIS — D2261 Melanocytic nevi of right upper limb, including shoulder: Secondary | ICD-10-CM | POA: Diagnosis not present

## 2021-07-26 DIAGNOSIS — F411 Generalized anxiety disorder: Secondary | ICD-10-CM | POA: Diagnosis not present

## 2021-07-27 ENCOUNTER — Ambulatory Visit (INDEPENDENT_AMBULATORY_CARE_PROVIDER_SITE_OTHER): Payer: BC Managed Care – PPO | Admitting: Family Medicine

## 2021-07-27 ENCOUNTER — Encounter: Payer: Self-pay | Admitting: Family Medicine

## 2021-07-27 VITALS — BP 116/83 | HR 91 | Temp 97.5°F | Resp 14 | Wt 174.0 lb

## 2021-07-27 DIAGNOSIS — R300 Dysuria: Secondary | ICD-10-CM | POA: Diagnosis not present

## 2021-07-27 DIAGNOSIS — R310 Gross hematuria: Secondary | ICD-10-CM | POA: Diagnosis not present

## 2021-07-27 LAB — POCT URINALYSIS DIPSTICK
Bilirubin, UA: NEGATIVE
Glucose, UA: NEGATIVE
Ketones, UA: NEGATIVE
Nitrite, UA: NEGATIVE
Protein, UA: NEGATIVE
Spec Grav, UA: <=1.005 — AB (ref 1.010–1.025)
Urobilinogen, UA: 0.2 E.U./dL
pH, UA: 6 (ref 5.0–8.0)

## 2021-07-27 MED ORDER — CIPROFLOXACIN HCL 500 MG PO TABS: 500.0000 mg | ORAL_TABLET | Freq: Two times a day (BID) | ORAL | 0 refills | Status: AC

## 2021-07-27 NOTE — Progress Notes (Signed)
I,[name],acting as a scribe for [name], FNP.,have documented all relevant documentation on the behalf of [name], FNP,as directed by  [name], FNP while in the presence of [name], FNP.   Established patient visit   Patient: Christie [name]   DOB: 18-Jul-1985   36 y.o. Female  MRN: [id number] Visit Date: 07/27/2021  Today's healthcare provider: [name], FNP  Patient presents for new patient visit to establish care.  Introduced to Publishing rights manager role and practice setting.  All questions answered.  Discussed provider/patient relationship and expectations.   Chief Complaint  Patient presents with   Dysuria   Subjective    Dysuria  This is a new problem. Episode onset: 3 days ago. The problem has been gradually worsening. The quality of the pain is described as burning. There has been no fever. Associated symptoms include hematuria and urgency. Pertinent negatives include no chills, nausea or vomiting. She has tried increased fluids for the symptoms. The treatment provided no relief.     Medications: Outpatient Medications Prior to Visit  Medication Sig   magnesium gluconate (MAGONATE) 500 MG tablet Take 500 mg by mouth daily.   Probiotic Product (PROBIOTIC PO) Take 1 tablet by mouth daily.   [DISCONTINUED] omeprazole (PRILOSEC) 40 MG capsule Take 1 capsule (40 mg total) by mouth daily.   No facility-administered medications prior to visit.    Review of Systems  Constitutional:  Negative for appetite change, chills, fatigue and fever.  Respiratory:  Negative for chest tightness and shortness of breath.   Cardiovascular:  Negative for chest pain and palpitations.  Gastrointestinal:  Negative for abdominal pain, nausea and vomiting.  Genitourinary:  Positive for dysuria, hematuria and urgency.  Neurological:  Negative for dizziness and weakness.      Objective    BP 116/83 (BP Location: Left Arm, Patient Position: Sitting, Cuff  Size: Normal)   Pulse 91   Temp (!) 97.5 F (36.4 C) (Oral)   Resp 14   Wt 174 lb (78.9 kg)   SpO2 99% Comment: room air  BMI 29.87 kg/m    Physical Exam Vitals and nursing note reviewed.  Constitutional:      General: She is not in acute distress.    Appearance: Normal appearance. She is overweight. She is not ill-appearing, toxic-appearing or diaphoretic.  HENT:     Head: Normocephalic and atraumatic.  Cardiovascular:     Rate and Rhythm: Normal rate and regular rhythm.     Pulses: Normal pulses.     Heart sounds: Normal heart sounds. No murmur heard.   No friction rub. No gallop.  Pulmonary:     Effort: Pulmonary effort is normal. No respiratory distress.     Breath sounds: Normal breath sounds. No stridor. No wheezing, rhonchi or rales.  Chest:     Chest wall: No tenderness.  Abdominal:     General: Bowel sounds are normal.     Palpations: Abdomen is soft.     Tenderness: There is abdominal tenderness. There is right CVA tenderness. There is no left CVA tenderness.  Musculoskeletal:        General: No swelling, tenderness, deformity or signs of injury. Normal range of motion.     Right lower leg: No edema.     Left lower leg: No edema.  Skin:    General: Skin is warm and dry.     Capillary Refill: Capillary refill takes less than 2 seconds.  Coloration: Skin is not jaundiced or pale.     Findings: No bruising, erythema, lesion or rash.  Neurological:     General: No focal deficit present.     Mental Status: She is alert and oriented to person, place, and time. Mental status is at baseline.     Cranial Nerves: No cranial nerve deficit.     Sensory: No sensory deficit.     Motor: No weakness.     Coordination: Coordination normal.  Psychiatric:        Mood and Affect: Mood normal.        Behavior: Behavior normal.        Thought Content: Thought content normal.        Judgment: Judgment normal.     Results for orders placed or performed in visit on 07/27/21   POCT Urinalysis Dipstick  Result Value Ref Range   Color, UA yellow    Clarity, UA clear    Glucose, UA Negative Negative   Bilirubin, UA negative    Ketones, UA negative    Spec Grav, UA <=1.005 (A) 1.010 - 1.025   Blood, UA small (hemolyzed)    pH, UA 6.0 5.0 - 8.0   Protein, UA Negative Negative   Urobilinogen, UA 0.2 0.2 or 1.0 E.U./dL   Nitrite, UA negative    Leukocytes, UA Moderate (2+) (A) Negative   Appearance     Odor      Assessment & Plan     Problem List Items Addressed This Visit       Genitourinary   Gross hematuria - Primary    Dripping of blood upon morning void x1 day Associated with dysuria        Relevant Orders   Urine Culture     Other   Dysuria    +R CVA pain Hx of generalized abdominal pain/back pain r/t constipation/stress UA positive/will send for culture Encouraged to use AZO OTC to assist with symptoms and water to flush kidneys        Relevant Medications   ciprofloxacin (CIPRO) 500 MG tablet   Other Relevant Orders   POCT Urinalysis Dipstick (Completed)   Urine Culture     Return if symptoms worsen or fail to improve.      Leilani Merl, FNP, have reviewed all documentation for this visit. The documentation on 07/27/21 for the exam, diagnosis, procedures, and orders are all accurate and complete.    [name], FNP  [facility] [phone] (phone) [phone] (fax)  [facility]

## 2021-07-27 NOTE — Assessment & Plan Note (Signed)
Dripping of blood upon morning void x1 day Associated with dysuria

## 2021-07-27 NOTE — Assessment & Plan Note (Signed)
+  R CVA pain Hx of generalized abdominal pain/back pain r/t constipation/stress UA positive/will send for culture Encouraged to use AZO OTC to assist with symptoms and water to flush kidneys

## 2021-07-29 LAB — URINE CULTURE

## 2021-08-09 ENCOUNTER — Other Ambulatory Visit: Payer: Self-pay

## 2021-08-09 ENCOUNTER — Encounter: Payer: Self-pay | Admitting: Gastroenterology

## 2021-08-09 ENCOUNTER — Ambulatory Visit: Payer: BC Managed Care – PPO | Admitting: Gastroenterology

## 2021-08-09 VITALS — BP 120/82 | HR 84 | Temp 98.3°F | Ht 64.0 in | Wt 175.4 lb

## 2021-08-09 DIAGNOSIS — M7918 Myalgia, other site: Secondary | ICD-10-CM

## 2021-08-09 DIAGNOSIS — K219 Gastro-esophageal reflux disease without esophagitis: Secondary | ICD-10-CM

## 2021-08-09 DIAGNOSIS — K5900 Constipation, unspecified: Secondary | ICD-10-CM

## 2021-08-09 NOTE — Patient Instructions (Signed)
1- Please get Pepcid OTC and take one daily. 2- Please purchase a Wedge Pillow to help you with your acid reflux and nausea. 3- Please take Miralax 17 Grams two times a day. 4- Please contact your primary care doctor and let him/her know that you need to be seen for pelvic and hip pain.  High-Fiber Eating Plan Fiber, also called dietary fiber, is a type of carbohydrate. It is found foods such as fruits, vegetables, whole grains, and beans. A high-fiber diet can have many health benefits. Your health care provider may recommend a high-fiber diet to help: Prevent constipation. Fiber can make your bowel movements more regular. Lower your cholesterol. Relieve the following conditions: Inflammation of veins in the anus (hemorrhoids). Inflammation of specific areas of the digestive tract (uncomplicated diverticulosis). A problem of the large intestine, also called the colon, that sometimes causes pain and diarrhea (irritable bowel syndrome, or IBS). Prevent overeating as part of a weight-loss plan. Prevent heart disease, type 2 diabetes, and certain cancers. What are tips for following this plan? Reading food labels  Check the nutrition facts label on food products for the amount of dietary fiber. Choose foods that have 5 grams of fiber or more per serving. The goals for recommended daily fiber intake include: Men (age 34 or younger): 34-38 g. Men (over age 34): 28-34 g. Women (age 34 or younger): 25-28 g. Women (over age 34): 22-25 g. Your daily fiber goal is _____________ g. Shopping Choose whole fruits and vegetables instead of processed forms, such as apple juice or applesauce. Choose a wide variety of high-fiber foods such as avocados, lentils, oats, and kidney beans. Read the nutrition facts label of the foods you choose. Be aware of foods with added fiber. These foods often have high sugar and sodium amounts per serving. Cooking Use whole-grain flour for baking and cooking. Cook with  brown rice instead of white rice. Meal planning Start the day with a breakfast that is high in fiber, such as a cereal that contains 5 g of fiber or more per serving. Eat breads and cereals that are made with whole-grain flour instead of refined flour or white flour. Eat brown rice, bulgur wheat, or millet instead of white rice. Use beans in place of meat in soups, salads, and pasta dishes. Be sure that half of the grains you eat each day are whole grains. General information You can get the recommended daily intake of dietary fiber by: Eating a variety of fruits, vegetables, grains, nuts, and beans. Taking a fiber supplement if you are not able to take in enough fiber in your diet. It is better to get fiber through food than from a supplement. Gradually increase how much fiber you consume. If you increase your intake of dietary fiber too quickly, you may have bloating, cramping, or gas. Drink plenty of water to help you digest fiber. Choose high-fiber snacks, such as berries, raw vegetables, nuts, and popcorn. What foods should I eat? Fruits Berries. Pears. Apples. Oranges. Avocado. Prunes and raisins. Dried figs. Vegetables Sweet potatoes. Spinach. Kale. Artichokes. Cabbage. Broccoli. Cauliflower. Green peas. Carrots. Squash. Grains Whole-grain breads. Multigrain cereal. Oats and oatmeal. Brown rice. Barley. Bulgur wheat. Millet. Quinoa. Bran muffins. Popcorn. Rye wafer crackers. Meats and other proteins Navy beans, kidney beans, and pinto beans. Soybeans. Split peas. Lentils. Nuts and seeds. Dairy Fiber-fortified yogurt. Beverages Fiber-fortified soy milk. Fiber-fortified orange juice. Other foods Fiber bars. The items listed above may not be a complete list of recommended foods and  beverages. Contact a dietitian for more information. What foods should I avoid? Fruits Fruit juice. Cooked, strained fruit. Vegetables Fried potatoes. Canned vegetables. Well-cooked  vegetables. Grains White bread. Pasta made with refined flour. White rice. Meats and other proteins Fatty cuts of meat. Fried chicken or fried fish. Dairy Milk. Yogurt. Cream cheese. Sour cream. Fats and oils Butters. Beverages Soft drinks. Other foods Cakes and pastries. The items listed above may not be a complete list of foods and beverages to avoid. Talk with your dietitian about what choices are best for you. Summary Fiber is a type of carbohydrate. It is found in foods such as fruits, vegetables, whole grains, and beans. A high-fiber diet has many benefits. It can help to prevent constipation, lower blood cholesterol, aid weight loss, and reduce your risk of heart disease, diabetes, and certain cancers. Increase your intake of fiber gradually. Increasing fiber too quickly may cause cramping, bloating, and gas. Drink plenty of water while you increase the amount of fiber you consume. The best sources of fiber include whole fruits and vegetables, whole grains, nuts, seeds, and beans. This information is not intended to replace advice given to you by your health care provider. Make sure you discuss any questions you have with your health care provider. Document Revised: 07/02/2019 Document Reviewed: 07/02/2019 Elsevier Patient Education  2023 Elsevier Inc.  Food Choices for Gastroesophageal Reflux Disease, Adult When you have gastroesophageal reflux disease (GERD), the foods you eat and your eating habits are very important. Choosing the right foods can help ease your discomfort. Think about working with a food expert (dietitian) to help you make good choices. What are tips for following this plan? Reading food labels Look for foods that are low in saturated fat. Foods that may help with your symptoms include: Foods that have less than 5% of daily value (DV) of fat. Foods that have 0 grams of trans fat. Cooking Do not fry your food. Cook your food by baking, steaming, grilling, or  broiling. These are all methods that do not need a lot of fat for cooking. To add flavor, try to use herbs that are low in spice and acidity. Meal planning  Choose healthy foods that are low in fat, such as: Fruits and vegetables. Whole grains. Low-fat dairy products. Lean meats, fish, and poultry. Eat small meals often instead of eating 3 large meals each day. Eat your meals slowly in a place where you are relaxed. Avoid bending over or lying down until 2-3 hours after eating. Limit high-fat foods such as fatty meats or fried foods. Limit your intake of fatty foods, such as oils, butter, and shortening. Avoid the following as told by your doctor: Foods that cause symptoms. These may be different for different people. Keep a food diary to keep track of foods that cause symptoms. Alcohol. Drinking a lot of liquid with meals. Eating meals during the 2-3 hours before bed. Lifestyle Stay at a healthy weight. Ask your doctor what weight is healthy for you. If you need to lose weight, work with your doctor to do so safely. Exercise for at least 30 minutes on 5 or more days each week, or as told by your doctor. Wear loose-fitting clothes. Do not smoke or use any products that contain nicotine or tobacco. If you need help quitting, ask your doctor. Sleep with the head of your bed higher than your feet. Use a wedge under the mattress or blocks under the bed frame to raise the head of the  bed. Chew sugar-free gum after meals. What foods should eat?  Eat a healthy, well-balanced diet of fruits, vegetables, whole grains, low-fat dairy products, lean meats, fish, and poultry. Each person is different. Foods that may cause symptoms in one person may not cause any symptoms in another person. Work with your doctor to find foods that are safe for you. The items listed above may not be a complete list of what you can eat and drink. Contact a food expert for more options. What foods should I  avoid? Limiting some of these foods may help in managing the symptoms of GERD. Everyone is different. Talk with a food expert or your doctor to help you find the exact foods to avoid, if any. Fruits Any fruits prepared with added fat. Any fruits that cause symptoms. For some people, this may include citrus fruits, such as oranges, grapefruit, pineapple, and lemons. Vegetables Deep-fried vegetables. [place] fries. Any vegetables prepared with added fat. Any vegetables that cause symptoms. For some people, this may include tomatoes and tomato products, chili peppers, onions and garlic, and horseradish. Grains Pastries or quick breads with added fat. Meats and other proteins High-fat meats, such as fatty beef or pork, hot dogs, ribs, ham, sausage, salami, and bacon. Fried meat or protein, including fried fish and fried chicken. Nuts and nut butters, in large amounts. Dairy Whole milk and chocolate milk. Sour cream. Cream. Ice cream. Cream cheese. Milkshakes. Fats and oils Butter. Margarine. Shortening. Ghee. Beverages Coffee and tea, with or without caffeine. Carbonated beverages. Sodas. Energy drinks. Fruit juice made with acidic fruits, such as orange or grapefruit. Tomato juice. Alcoholic drinks. Sweets and desserts Chocolate and cocoa. Donuts. Seasonings and condiments Pepper. Peppermint and spearmint. Added salt. Any condiments, herbs, or seasonings that cause symptoms. For some people, this may include curry, hot sauce, or vinegar-based salad dressings. The items listed above may not be a complete list of what you should not eat and drink. Contact a food expert for more options. Questions to ask your doctor Diet and lifestyle changes are often the first steps that are taken to manage symptoms of GERD. If diet and lifestyle changes do not help, talk with your doctor about taking medicines. Where to find more information International Foundation for Gastrointestinal Disorders:  aboutgerd.org Summary When you have GERD, food and lifestyle choices are very important in easing your symptoms. Eat small meals often instead of 3 large meals a day. Eat your meals slowly and in a place where you are relaxed. Avoid bending over or lying down until 2-3 hours after eating. Limit high-fat foods such as fatty meats or fried foods. This information is not intended to replace advice given to you by your health care provider. Make sure you discuss any questions you have with your health care provider. Document Revised: 09/07/2019 Document Reviewed: 09/07/2019 Elsevier Patient Education  2023 Elsevier Inc.  Famotidine Tablets What is this medication? FAMOTIDINE (fa MOE ti deen) treats heartburn, stomach ulcers, reflux disease, or other conditions that cause too much stomach acid. It works by reducing the amount of acid in the stomach. This medicine may be used for other purposes; ask your health care provider or pharmacist if you have questions. COMMON BRAND NAME(S): Heartburn Relief, Pepcid, Pepcid AC, Pepcid AC Maximum Strength, Zantac, Zantac 360 What should I tell my care team before I take this medication? They need to know if you have any of these conditions: Kidney disease Liver disease Trouble swallowing An unusual or allergic reaction to  famotidine, other medications, foods, dyes, or preservatives Pregnant or trying to get pregnant Breast-feeding How should I use this medication? Take this medication by mouth with a glass of water. Follow the directions on the prescription label. If you only take this medication once a day, take it at bedtime. Take your doses at regular intervals. Do not take your medication more often than directed. Talk to your care team about the use of this medication in children. Special care may be needed. Overdosage: If you think you have taken too much of this medicine contact a poison control center or emergency room at once. NOTE: This  medicine is only for you. Do not share this medicine with others. What if I miss a dose? If you miss a dose, take it as soon as you can. If it is almost time for your next dose, take only that dose. Do not take double or extra doses. What may interact with this medication? Delavirdine Itraconazole Ketoconazole This list may not describe all possible interactions. Give your health care provider a list of all the medicines, herbs, non-prescription drugs, or dietary supplements you use. Also tell them if you smoke, drink alcohol, or use illegal drugs. Some items may interact with your medicine. What should I watch for while using this medication? Tell your care team if your condition does not start to get better or if it gets worse. Do not take with aspirin, ibuprofen or other antiinflammatory medications. These can make your condition worse. Do not smoke cigarettes or drink alcohol. These cause irritation in your stomach and can increase the time it will take for ulcers to heal. If you get black, tarry stools or vomit up what looks like coffee grounds, call your care team at once. You may have a bleeding ulcer. This medication may cause a decrease in vitamin B12. You should make sure that you get enough vitamin B12 while you are taking this medication. Discuss the foods you eat and the vitamins you take with your care team. What side effects may I notice from receiving this medication? Side effects that you should report to your care team as soon as possible: Allergic reactions--skin rash, itching, hives, swelling of the face, lips, tongue, or throat Confusion Hallucinations Side effects that usually do not require medical attention (report to your care team if they continue or are bothersome): Constipation Diarrhea Dizziness Headache This list may not describe all possible side effects. Call your doctor for medical advice about side effects. You may report side effects to FDA at  1-800-FDA-1088. Where should I keep my medication? Keep out of the reach of children and pets. Store at room temperature between 15 and 30 degrees C (59 and 86 degrees F). Do not freeze. Throw away any unused medication after the expiration date. NOTE: This sheet is a summary. It may not cover all possible information. If you have questions about this medicine, talk to your doctor, pharmacist, or health care provider.  2023 Elsevier/Gold Standard (2020-03-01 00:00:00)

## 2021-08-09 NOTE — Progress Notes (Signed)
Wyline Mood MD, MRCP(U.K) [address]  [address]  [place], [place] [address]  Main: [phone]  Fax: [phone]   Gastroenterology Consultation  Referring Provider:     [name], FNP Primary Care Physician:  [name], FNP Primary Gastroenterologist:  Dr. Wyline Mood  Reason for Consultation:     Abdominal pain        HPI:   Christie [name] is a 36 y.o. y/o female referred for consultation & management  by. [name], FNP.     She has been referred for abdominal pain.  06/27/2021: TSH normal, CMP normal, 03/23/2021 H. pylori breath test negative  She states that for 1 year she has had pain points to her left groin and right groin.  The pain occurs once a week or twice a week and last all the way till she goes to bed no clear aggravating or relieving factors.  Worse with movement of her hip particularly on the left side.  Denies any NSAID use.  Some nausea in the morning denies any heartburn.  Has some had some issues with constipation tried MiraLAX but not on a regular basis diet is poor in fiber.  No pain during sexual intercourse no significant pain during her.'s she has an IUD in place at this point of time.  No unintentional weight loss.  No blood in her stool.  Past Medical History:  Diagnosis Date   GERD (gastroesophageal reflux disease)    History of gestational hypertension    Pregnancy 01/17/2017   Pregnancy induced hypertension     Past Surgical History:  Procedure Laterality Date   NO PAST SURGERIES     WISDOM TOOTH EXTRACTION      Prior to Admission medications   Medication Sig Start Date End Date Taking? Authorizing Provider  levonorgestrel (MIRENA, 52 MG,) 20 MCG/DAY IUD Mirena 21 mcg/24 hours (8 yrs) 52 mg intrauterine device  provided by care center   Yes [provider]  Probiotic Product (CULTURELLE PROBIOTICS PO) Take 1 capsule by mouth daily.   Yes [provider]    Family History  Problem Relation Age of  Onset   Melanoma Mother    Thyroid disease Mother    Hypertension Mother    Breast cancer Neg Hx      Social History   Tobacco Use   Smoking status: Never   Smokeless tobacco: Never  Vaping Use   Vaping Use: Never used  Substance Use Topics   Alcohol use: No    Alcohol/week: 0.0 standard drinks   Drug use: No    Allergies as of 08/09/2021 - Review Complete 08/09/2021  Allergen Reaction Noted   Bactrim [sulfamethoxazole-trimethoprim] Hives 04/13/2018    Review of Systems:    All systems reviewed and negative except where noted in HPI.   Physical Exam:  BP 120/82   Pulse 84   Temp 98.3 F (36.8 C) (Oral)   Ht 5\' 4"  (1.626 m)   Wt 175 lb 6.4 oz (79.6 kg)   BMI 30.11 kg/m  No LMP recorded. (Menstrual status: IUD). Psych:  Alert and cooperative. Normal mood and affect. General:   Alert,  Well-developed, well-nourished, pleasant and cooperative in NAD Head:  Normocephalic and atraumatic. Eyes:  Sclera clear, no icterus.   Conjunctiva pink. throughout to auscultation.   No wheezes, crackles, or rhonchi. No acute distress. Heart:  Regular rate and rhythm; no murmurs, clicks, rubs, or gallops. Abdomen:  Normal bowel sounds.  No bruits.  Soft, non-tender and non-distended without masses, hepatosplenomegaly or hernias noted.  No guarding or rebound tenderness.    Neurologic:  Alert and oriented x3;  grossly normal neurologically. Psych:  Alert and cooperative. Normal mood and affect.   Chaperone present in the room tenderness present in the left groin and discomfort is worse on internal and external rotation of the left hip.  Similar tenderness on the right groin.  Tenderness over the left trochanteric bursa Imaging Studies: No results found.  Assessment and Plan:   Christie [name] is a 36 y.o. y/o female has been referred for abdominal pain.  On examination she has pain in the left and right groin worse on internal and external rotation at her hip and tenderness over her  left trochanteric bursa.  I explained to her this pain is not related to her GI tract and is musculoskeletal in nature.  Unrelated to this she has some longstanding issues with constipation.  Her diet is very poor in fiber suggested her to start her diet with 25 g of fiber per day patient information provided start taking MiraLAX 1 capful twice a day.  If no better to contact my office.  She also has early morning nausea also unrelated to her groin pain which is probably due to reflux and a trial of Pepcid with lifestyle changes for acid reflux is recommended patient information is provided.  She should follow-up with her PCP/orthopedic for evaluation of musculoskeletal pain  Follow up in as needed  Dr Wyline Mood MD,MRCP(U.K)

## 2021-08-16 DIAGNOSIS — F411 Generalized anxiety disorder: Secondary | ICD-10-CM | POA: Diagnosis not present

## 2021-09-14 DIAGNOSIS — F411 Generalized anxiety disorder: Secondary | ICD-10-CM | POA: Diagnosis not present

## 2021-09-18 ENCOUNTER — Encounter: Payer: Self-pay | Admitting: Physical Therapy

## 2021-09-18 ENCOUNTER — Ambulatory Visit: Payer: BC Managed Care – PPO | Attending: Physician Assistant | Admitting: Physical Therapy

## 2021-09-18 DIAGNOSIS — M533 Sacrococcygeal disorders, not elsewhere classified: Secondary | ICD-10-CM | POA: Insufficient documentation

## 2021-09-18 DIAGNOSIS — M6289 Other specified disorders of muscle: Secondary | ICD-10-CM | POA: Diagnosis not present

## 2021-09-18 DIAGNOSIS — R278 Other lack of coordination: Secondary | ICD-10-CM | POA: Insufficient documentation

## 2021-09-18 DIAGNOSIS — R2689 Other abnormalities of gait and mobility: Secondary | ICD-10-CM | POA: Diagnosis not present

## 2021-09-18 NOTE — Therapy (Signed)
OUTPATIENT PHYSICAL THERAPY EVALUATION   Patient Name: Christie [name] MRN: [id number] DOB:1985/05/13, 36 y.o., female Today's Date: 09/19/2021   PT End of Session - 09/18/21 1627     Visit Number 1    Number of Visits 10    Date for PT Re-Evaluation 11/27/21    PT Start Time [id number]    PT Stop Time 1720    PT Time Calculation (min) 59 min             Past Medical History:  Diagnosis Date   GERD (gastroesophageal reflux disease)    History of gestational hypertension    Pregnancy 01/17/2017   Pregnancy induced hypertension    Past Surgical History:  Procedure Laterality Date   NO PAST SURGERIES     WISDOM TOOTH EXTRACTION     Patient Active Problem List   Diagnosis Date Noted   Gross hematuria 07/27/2021   Dysuria 07/27/2021   Generalized abdominal pain 06/27/2021   Slow transit constipation 03/23/2021   Non-recurrent acute suppurative otitis media of left ear without spontaneous rupture of tympanic membrane 03/23/2021   Concussion 06/10/2020   Biological false positive RPR test 07/04/2016   Hernia, hiatal 10/01/2014   GERD (gastroesophageal reflux disease) 10/01/2014      REFERRING PROVIDER: Minda Ditto   REFERRING DIAG:  M62.89 (ICD-10-CM) - Pelvic floor dysfunction  Rationale for Evaluation and Treatment Rehabilitation  THERAPY DIAG:  Sacrococcygeal disorders, not elsewhere classified  Other lack of coordination  Other abnormalities of gait and mobility  ONSET DATE: 5 years ago after 1st child   SUBJECTIVE:                                                                                                                                                                                           SUBJECTIVE STATEMENT: 1) Pt had B "hip flexor pain" after giving birth with her 1st child 5 years ago and worsened after her second child  2 years ago.  It occurs with sitting and standing long periods or if she had active day.  Pt feels does hip flexor stretches  , she gets relief. Pt has been to gynecologist, GI.  Pain is tight and dull constantly and she feels the need to stretch. Pt termed " hip flexor pain".  Pt has to hoist herself with high car but no increased pain. Stairs are no problem.  At worst 5/10, on average 3/10. Denied radiating pain   2) Pt has pelvic pain ( pt points to supra pubic area)  which started 6 months after her 2nd child was born.  At its worst 7/10, on average 4/10.  Denied radiating pain. Pt is not able to figure out a pattern that triggers it. Pt has been to GI and gynecologist without finding a Tx that helped the pain.  Bowel movements occur daily / every other day. Sometimes straining 15-20%.  Type 4 consistency.  Pt does not feel complete with urination and has to go again. Occasionally with SUI.  No pain with urination / bowel movements/ sexual intercourse/ pelvic exams tampons.  Hx of fall onto her back April last year after she passed out and had bruising on her buttocks.  Pt also got an IUD in October last year and and the pelvic Sx increased and she experienced labor pain during the insertion of IUD and hours after.   Denies injuries   3) B knee pain for 10-15 years with cracking in deep squats and going downstairs  2/10     PERTINENT HISTORY:  Gynecological Hx: 1st vaginal delivery with 2-3 perineal stitches, 2nd vaginal and no tearing. Pt had elevated HB during pregnancy and had induced labor. Second pregnancy with LBP and "hip flexor" pain. Pt enjoys yoga and wants to get back to yoga.   PAIN:  Are you having pain? Yes:  Pain location: B hip flexors and pelvic pain  Pain description: see above  Aggravating factors: see above Relieving factors: see above     PRECAUTIONS: None  WEIGHT BEARING RESTRICTIONS No  FALLS:  Has patient fallen in last 6 months? No  LIVING ENVIRONMENT: Lives with: lives with their family Lives in: House/apartment Stairs: Yes: External: 3 steps; rail  Has following equipment at  home: None  OCCUPATION:  Chiropodist ( moderate sedentary job)   PLOF: Independent  PATIENT GOALS                Decrease hip flexor and pelvic pain and return to yoga    OBJECTIVE:    [facility] PT Assessment - 09/18/21 1708        Strength   Overall Strength Comments R hip abd 3-/5, L 3+/5  ( post Tx: R hip abd 3+/5)      Palpation   SI assessment  standing: R iliac higher., supine: L ASIS / medial malleoli higher  ( Post Tx: supine: levelled ASIS, standing R iliac crest still higher)    Palpation comment through clothing, pelvic floor: R side mm tightness / no tendeness . ( Post Tx: increased mm mobility on R side)   no pain at the pubic symphysis.   R hypomobile SIJ in FABDER, FADDIR   ( post Tx: improved mobility R SIJ in these two movements)      Ambulation/Gait   Gait Comments 1.14 m/s, excessive anterio rotatino of R ilia, limited posterior rotation of L ilia, excessive sway to R pelvis   ( post Tx: 1.34 m/s with reciprocal gait pattern )    Spinal ROM:   WFL all directions, no reproduction of pain         [facility] Adult PT Treatment/Exercise - 09/19/21 1724       Ambulation/Gait   Gait Comments 1.14 m/s, excessive anterio rotatino of R ilia, limited posterior rotation of L ilia, excessive sway to R pelvis ( post Tx: 1.34 m/s with reciprocal gait pattern )      Therapeutic Activites    Other Therapeutic Activities explained approaches of regiona interdependent , structual alignment to restore to address her areas of pain      Neuro Re-ed    Neuro Re-ed Details  cued for proper sitting and sidelying posture and provided rationale      Manual Therapy   Manual therapy comments AP mob at R SIJ to promote FADDIR and FABDER                   HOME EXERCISE PROGRAM: See pt instruction section    ASSESSMENT:  CLINICAL IMPRESSION: Patient is a 36 y.o. female who was seen today for physical therapy evaluation and treatment for Bhip flexor, pelvic pain,  and B knee pain which impact her ADLs and QOL. Pt's musculoskeletal assessment revealed uneven pelvic girdle, R pelvic floor tightness, R hip abduction weakness, gait deviations ( limited posterior rotation of L ilia, and poor body mechanics which places strain on the abdominal/pelvic floor mm.    Pt was provided education on etiology of Sx with anatomy, physiology explanation with images along with the benefits of customized pelvic PT Tx based on pt's medical conditions and musculoskeletal deficits.  Explained the physiology of deep core mm coordination and roles of pelvic floor function in urination, defecation, sexual function, and postural control with deep core mm system.   Regional interdependent approaches will yield greater benefits.   Following Tx today which pt tolerated without complaints, pt demo'd equal alignment of pelvic girdle in supine, increased R hip abduction, R SIJ mobility, and more reciprocal gait pattern with quicker speed. Plan to assess knee alignment and lower kinetic chain, pelvic floor and perineal scar restrictions given pt's hx of perineal stitches with 1st pregnancy.  Pt benefits from skilled PT.    OBJECTIVE IMPAIRMENTS Abnormal gait, decreased activity tolerance, decreased coordination, decreased endurance, decreased mobility, difficulty walking, decreased strength, decreased safety awareness, hypomobility, increased fascial restrictions, increased muscle spasms, impaired flexibility, improper body mechanics, postural dysfunction, and pain.   ACTIVITY LIMITATIONS sitting, sleeping, continence, and toileting  PARTICIPATION LIMITATIONS: interpersonal relationship, community activity, and occupation  PERSONAL FACTORS  Past/current experiences are also affecting patient's functional outcome.   REHAB POTENTIAL: Good  CLINICAL DECISION MAKING: Evolving/moderate complexity  EVALUATION COMPLEXITY: Moderate   PATIENT EDUCATION:   Education details: Showed pt  anatomy images. Explained muscles attachments/ connection, physiology of deep core system/ spinal- thoracic-pelvis-lower kinetic chain as they relate to pt's presentation, Sx, and past Hx. Explained what and how these areas of deficits need to be restored to balance and function   Person educated: Patient Education method: Explanation, Demonstration, Tactile cues, Verbal cues, and Handouts Education comprehension: verbalized understanding, returned demonstration, verbal cues required, tactile cues required, and needs further education   PLAN: PT FREQUENCY: 1x/week  PT DURATION: 10 weeks  PLANNED INTERVENTIONS: Therapeutic exercises, Therapeutic activity, Neuromuscular re-education, Balance training, Gait training, Patient/Family education, Joint mobilization, Dry Needling, Spinal mobilization, Cryotherapy, Moist heat, Taping, and Manual therapy.  PLAN FOR NEXT SESSION: see pt clinical impression     GOALS: Goals reviewed with patient? Yes  SHORT TERM GOALS: Target date: 10/17/2021  Pt wqill be IND with HEP Baseline: not IND  Goal status: INITIAL   LONG TERM GOALS: Target date: 11/28/2021  Pt will demo > 5 pt change on FOTO  to improve QOL and function Urinary Problem baseline Pelvic Pain baseline Bowel baseline   Baseline:  Goal status: INITIAL  2.  Pt will report feeling complete with urination and not have to return again to toilet across 2/3 trips in order to perform work dutes  Baseline: feel incomplete 30-40 % of the time  Goal status: INITIAL  3.  Pt will report being able  to sit and stand for > 30 min without hip flexor pain in order to play with kids and work  Baseline: 20 mins onset  Goal status: INITIAL  4.  Pt will have no B knee pain with descending stairs  Baseline: pain Goal status: INITIAL  5.  Pt will demo levelled pelvic girdle and shoulder height in order to progress to deep core strengthening HEP and restore mobility at spine, pelvis, gait, posture  to practice yoga with less risk for injuries   Baseline: standing: R iliac higher., supine: L ASIS / medial malleoli higher  ( Post Tx, 09/18/21: supine: levelled ASIS, standing R iliac crest still higher)  Goal status: INITIAL  6.  Pt will demo increased gait speed t o> 1.34 m/s in order to maintain walking routine Baseline: 1.19 m/s  Goal status: INITIAL   Jerl Mina, PT 09/19/2021, 5:26 PM

## 2021-09-18 NOTE — Patient Instructions (Signed)
  Reclined twist for hips and side of the hips/ legs  Lay on your back, knees bend Scoot hips to the R , leave shoulders in place Drop knees to the L side resting onto pillows to keep leg at the same width of hips Pillow under L thigh to minimize too much strain   __  Sit with out crossing ankles, fours points of contact   ___  Pillow between knees with keeping on side

## 2021-09-26 ENCOUNTER — Ambulatory Visit: Payer: BC Managed Care – PPO | Admitting: Gastroenterology

## 2021-09-27 ENCOUNTER — Ambulatory Visit: Payer: BC Managed Care – PPO | Admitting: Physical Therapy

## 2021-09-27 DIAGNOSIS — M6289 Other specified disorders of muscle: Secondary | ICD-10-CM | POA: Diagnosis not present

## 2021-09-27 DIAGNOSIS — R278 Other lack of coordination: Secondary | ICD-10-CM

## 2021-09-27 DIAGNOSIS — M533 Sacrococcygeal disorders, not elsewhere classified: Secondary | ICD-10-CM

## 2021-09-27 DIAGNOSIS — R2689 Other abnormalities of gait and mobility: Secondary | ICD-10-CM | POA: Diagnosis not present

## 2021-09-27 NOTE — Patient Instructions (Signed)
Stretch for pelvic floor     Frog stretch: laying on belly with pillow under hips, knees bent, inhale do nothing, exhale let ankles fall apart     Prone Heel Press for strengthening sacro-iliac joints  1. Lie on your belly. If you have an arch in your low back or it feels umcomfortable, place a pillow under your low belly/hips to make sure your low back feel comfortable.   2. Place our forehead on top of your palms.      Widen your knees apart for starting position.   3. Inhale, feel belly and low back expand  4. Exhale, feel belly hug in, press heel together and count aloud for 5 sec. Then relax the heel squeezing.  Perform 10 reps of 5 sec holds. 2 sets/ day.    If you feel entire buttock tighten too much or feel low back pain, apply 50% less effort. As you press your heel together, you will feel as if your pubic bone (front of your pelvis) and sacrum (back of your pelvis) gentle move towards each other or your low abdominal muscles hug in more.  15 reps   __        3- foot tap  10 reps  Each side   Hold onto wall   Slightly bend of standing knee, and keep hips above foot   [place] of opposite leg   taps to each direction and   back to spot under hips- notice equal pressure through both legs, and across ballmound and heels    ___

## 2021-09-27 NOTE — Therapy (Signed)
OUTPATIENT PHYSICAL THERAPY Treatment   Patient Name: Jae Bruck MRN: [id number] DOB:27-Jul-1985, 36 y.o., female Today's Date: 09/27/2021   PT End of Session - 09/27/21 1110     Visit Number 2    Number of Visits 10    Date for PT Re-Evaluation 11/27/21    PT Start Time 1107    PT Stop Time 1150    PT Time Calculation (min) 43 min             Past Medical History:  Diagnosis Date   GERD (gastroesophageal reflux disease)    History of gestational hypertension    Pregnancy 01/17/2017   Pregnancy induced hypertension    Past Surgical History:  Procedure Laterality Date   NO PAST SURGERIES     WISDOM TOOTH EXTRACTION     Patient Active Problem List   Diagnosis Date Noted   Gross hematuria 07/27/2021   Dysuria 07/27/2021   Generalized abdominal pain 06/27/2021   Slow transit constipation 03/23/2021   Non-recurrent acute suppurative otitis media of left ear without spontaneous rupture of tympanic membrane 03/23/2021   Concussion 06/10/2020   Biological false positive RPR test 07/04/2016   Hernia, hiatal 10/01/2014   GERD (gastroesophageal reflux disease) 10/01/2014      REFERRING PROVIDER: Florina Ou   REFERRING DIAG:  M62.89 (ICD-10-CM) - Pelvic floor dysfunction  Rationale for Evaluation and Treatment Rehabilitation  THERAPY DIAG:  Sacrococcygeal disorders, not elsewhere classified  Other lack of coordination  Other abnormalities of gait and mobility  ONSET DATE: 5 years ago after 1st child   SUBJECTIVE:                                                                                                                                                                                           SUBJECTIVE STATEMENT: Pt reported 30-40% decreased pelvic pain. Hip flexor pain reduced too but she notices it in her R hip right now .  Pt has practiced sitting without less crossing of her legs and also now sleeps in her side with pillow between knees. Pt also  hosted a party with lots of lifting, carrying, stairs and even experienced the decreased level of pain after this day.    PERTINENT HISTORY:  Gynecological Hx: 1st vaginal delivery with 2-3 perineal stitches, 2nd vaginal and no tearing. Pt had elevated HB during pregnancy and had induced labor. Second pregnancy with LBP and "hip flexor" pain. Pt enjoys yoga and wants to get back to yoga.   PAIN:  Are you having pain now? R hip flexor and lower back pain at 3/10      PRECAUTIONS: None  WEIGHT BEARING RESTRICTIONS No  FALLS:  Has patient fallen in last 6 months? No  LIVING ENVIRONMENT: Lives with: lives with their family Lives in: House/apartment Stairs: Yes: External: 3 steps; rail  Has following equipment at home: None  OCCUPATION:  Chiropodist ( moderate sedentary job)   PLOF: Independent  PATIENT GOALS                Decrease hip flexor and pelvic pain and return to yoga    OBJECTIVE:     [facility] PT Assessment - 09/27/21 1113       Strength   Overall Strength Comments R hip abd 4-/5, L hip abd 5/5      Palpation   SI assessment  R iliac crest slightly higher    Palpation comment tightness/ tenderness at R lower hi abductor mm, coccygeus, obt int R, hypomobile R SIJ limited in counternutation      Ambulation/Gait   Gait Comments 1.25 m/s ( reciprocal pattern , equal rotation of pelvis, thoracic rotation / arm swing slightly limited             [facility] Adult PT Treatment/Exercise - 09/27/21 1113       Neuro Re-ed    Neuro Re-ed Details  cued for new HEP for SIJ stability and posterior pelvic floor lengthening      Manual Therapy   Manual therapy comments idstraction, AP mob at R SIJ, STM/MWM at problem areas noted in assessment                  HOME EXERCISE PROGRAM: See pt instruction section    ASSESSMENT:  CLINICAL IMPRESSION:  Pt responded well with 30-40% improved pelvic pain and demo'd levelled pelvic girdle and improving DRA  after last session. Today, manual Tx was applied to improve R SIJ and posterior pelvic floor./ distal hip abduction mm after which she reported decreased pain at R hip flexor and LBP from 3/10 to 0/10.  Pressure and technique was modified to help pt tolerate Tx with no tenderness / pain. Plan to add deep core strengthening at next session, Pt required cues for SIJ stability HEP for technique.   Pt benefits from skilled PT.    OBJECTIVE IMPAIRMENTS Abnormal gait, decreased activity tolerance, decreased coordination, decreased endurance, decreased mobility, difficulty walking, decreased strength, decreased safety awareness, hypomobility, increased fascial restrictions, increased muscle spasms, impaired flexibility, improper body mechanics, postural dysfunction, and pain.   ACTIVITY LIMITATIONS sitting, sleeping, continence, and toileting  PARTICIPATION LIMITATIONS: interpersonal relationship, community activity, and occupation  PERSONAL FACTORS  Past/current experiences are also affecting patient's functional outcome.   REHAB POTENTIAL: Good  CLINICAL DECISION MAKING: Evolving/moderate complexity  EVALUATION COMPLEXITY: Moderate   PATIENT EDUCATION:   Education details: Showed pt anatomy images. Explained muscles attachments/ connection, physiology of deep core system/ spinal- thoracic-pelvis-lower kinetic chain as they relate to pt's presentation, Sx, and past Hx. Explained what and how these areas of deficits need to be restored to balance and function   Person educated: Patient Education method: Explanation, Demonstration, Tactile cues, Verbal cues, and Handouts Education comprehension: verbalized understanding, returned demonstration, verbal cues required, tactile cues required, and needs further education   PLAN: PT FREQUENCY: 1x/week  PT DURATION: 10 weeks  PLANNED INTERVENTIONS: Therapeutic exercises, Therapeutic activity, Neuromuscular re-education, Balance training, Gait  training, Patient/Family education, Joint mobilization, Dry Needling, Spinal mobilization, Cryotherapy, Moist heat, Taping, and Manual therapy.  PLAN FOR NEXT SESSION: see pt clinical impression     GOALS: Goals reviewed  with patient? Yes  SHORT TERM GOALS: Target date: 10/25/2021  Pt wqill be IND with HEP Baseline: not IND  Goal status: INITIAL   LONG TERM GOALS: Target date: 12/06/2021  Pt will demo > 5 pt change on FOTO  to improve QOL and function Urinary Problem baseline Pelvic Pain baseline Bowel baseline   Baseline:  Goal status: INITIAL  2.  Pt will report feeling complete with urination and not have to return again to toilet across 2/3 trips in order to perform work dutes  Baseline: feel incomplete 30-40 % of the time  Goal status: INITIAL  3.  Pt will report being able to sit and stand for > 30 min without hip flexor pain in order to play with kids and work  Baseline: 20 mins onset  Goal status: INITIAL  4.  Pt will have no B knee pain with descending stairs  Baseline: pain Goal status: INITIAL  5.  Pt will demo levelled pelvic girdle and shoulder height in order to progress to deep core strengthening HEP and restore mobility at spine, pelvis, gait, posture to practice yoga with less risk for injuries   Baseline: standing: R iliac higher., supine: L ASIS / medial malleoli higher  ( Post Tx, 09/18/21: supine: levelled ASIS, standing R iliac crest still higher)  Goal status: INITIAL  6.  Pt will demo increased gait speed t o> 1.34 m/s in order to maintain walking routine Baseline: 1.19 m/s  Goal status: INITIAL   Mariane Masters, PT 09/27/2021, 11:59 AM

## 2021-10-04 ENCOUNTER — Ambulatory Visit: Payer: BC Managed Care – PPO | Admitting: Physical Therapy

## 2021-10-04 DIAGNOSIS — R278 Other lack of coordination: Secondary | ICD-10-CM | POA: Diagnosis not present

## 2021-10-04 DIAGNOSIS — M6289 Other specified disorders of muscle: Secondary | ICD-10-CM | POA: Diagnosis not present

## 2021-10-04 DIAGNOSIS — R2689 Other abnormalities of gait and mobility: Secondary | ICD-10-CM | POA: Diagnosis not present

## 2021-10-04 DIAGNOSIS — M533 Sacrococcygeal disorders, not elsewhere classified: Secondary | ICD-10-CM | POA: Diagnosis not present

## 2021-10-04 NOTE — Therapy (Addendum)
OUTPATIENT PHYSICAL THERAPY Treatment   Patient Name: Christie [name] MRN: [id number] DOB:1985/07/12, 36 y.o., female Today's Date: 10/04/2021   PT End of Session - 10/04/21 1508     Visit Number 3    Number of Visits 10    Date for PT Re-Evaluation 11/27/21    PT Start Time 1506    PT Stop Time 1545    PT Time Calculation (min) 39 min    Activity Tolerance Patient tolerated treatment well             Past Medical History:  Diagnosis Date   GERD (gastroesophageal reflux disease)    History of gestational hypertension    Pregnancy 01/17/2017   Pregnancy induced hypertension    Past Surgical History:  Procedure Laterality Date   NO PAST SURGERIES     WISDOM TOOTH EXTRACTION     Patient Active Problem List   Diagnosis Date Noted   Gross hematuria 07/27/2021   Dysuria 07/27/2021   Generalized abdominal pain 06/27/2021   Slow transit constipation 03/23/2021   Non-recurrent acute suppurative otitis media of left ear without spontaneous rupture of tympanic membrane 03/23/2021   Concussion 06/10/2020   Biological false positive RPR test 07/04/2016   Hernia, hiatal 10/01/2014   GERD (gastroesophageal reflux disease) 10/01/2014      REFERRING PROVIDER: Florina Ou   REFERRING DIAG:  M62.89 (ICD-10-CM) - Pelvic floor dysfunction  Rationale for Evaluation and Treatment Rehabilitation  THERAPY DIAG:  Sacrococcygeal disorders, not elsewhere classified  Other lack of coordination  Other abnormalities of gait and mobility  ONSET DATE: 5 years ago after 1st child   SUBJECTIVE:                                                                                                                                                                                           SUBJECTIVE STATEMENT: Pt reported noticing her pelvic pain less. Pt's R hip pain improved 50% improvement after last session. After her Tx , she went to the beach, biked, walked a lot, carried her baby. Today  she feels her R hip pain bothering. 4/10. Pt is doing her HEP   PERTINENT HISTORY:  Gynecological Hx: 1st vaginal delivery with 2-3 perineal stitches, 2nd vaginal and no tearing. Pt had elevated HB during pregnancy and had induced labor. Second pregnancy with LBP and "hip flexor" pain. Pt enjoys yoga and wants to get back to yoga.      PRECAUTIONS: None  WEIGHT BEARING RESTRICTIONS No  FALLS:  Has patient fallen in last 6 months? No  LIVING ENVIRONMENT: Lives with: lives with their family Lives in: House/apartment Stairs:  Yes: External: 3 steps; rail  Has following equipment at home: None  OCCUPATION:  Chiropodist ( moderate sedentary job)   PLOF: Independent  PATIENT GOALS                Decrease hip flexor and pelvic pain and return to yoga    OBJECTIVE:      [facility] PT Assessment - 10/04/21 1512       Palpation   SI assessment  R iliac crest slightly higher (post Tx: levelled)    Palpation comment tightness along lateral line of fascia/ SIJ/ hypomobile midfoot joint, limited DF/EV, toe abduction R             [facility] Adult PT Treatment/Exercise - 10/04/21 1618       Neuro Re-ed    Neuro Re-ed Details  cued for gait mechanics, L side stretches after holding child on L side      Manual Therapy   Manual therapy comments long axis distraction/ STM/MWM at areas noted in assessment to promote ER of femur/ tibia/ DF/EV              HOME EXERCISE PROGRAM: See pt instruction section    ASSESSMENT:  CLINICAL IMPRESSION:  Pt reported a change with her R hip pain from 4/10 to 0/10 after manual Tx which addressed the tightness along her lateral lower kinetic chain and limited DF/EV and abduction of toes. Pt was educated on wearing wider shoes, gait mechanics to promote more mobility and less heel striking/ supination which is associated with her SIJ asymmetries. Pt demo'd improved techniques with cues.  Plan to assessing lower kinetic chain and add deep  core strengthening for pelvic stability.  Pt benefits from skilled PT.    OBJECTIVE IMPAIRMENTS Abnormal gait, decreased activity tolerance, decreased coordination, decreased endurance, decreased mobility, difficulty walking, decreased strength, decreased safety awareness, hypomobility, increased fascial restrictions, increased muscle spasms, impaired flexibility, improper body mechanics, postural dysfunction, and pain.   ACTIVITY LIMITATIONS sitting, sleeping, continence, and toileting  PARTICIPATION LIMITATIONS: interpersonal relationship, community activity, and occupation  PERSONAL FACTORS  Past/current experiences are also affecting patient's functional outcome.   REHAB POTENTIAL: Good  CLINICAL DECISION MAKING: Evolving/moderate complexity  EVALUATION COMPLEXITY: Moderate   PATIENT EDUCATION:   Education details: Showed pt anatomy images. Explained muscles attachments/ connection, physiology of deep core system/ spinal- thoracic-pelvis-lower kinetic chain as they relate to pt's presentation, Sx, and past Hx. Explained what and how these areas of deficits need to be restored to balance and function   See clinical impression  Person educated: Patient Education method: Explanation, Demonstration, Tactile cues, Verbal cues, and Handouts Education comprehension: verbalized understanding, returned demonstration, verbal cues required, tactile cues required, and needs further education   PLAN: PT FREQUENCY: 1x/week  PT DURATION: 10 weeks  PLANNED INTERVENTIONS: Therapeutic exercises, Therapeutic activity, Neuromuscular re-education, Balance training, Gait training, Patient/Family education, Joint mobilization, Dry Needling, Spinal mobilization, Cryotherapy, Moist heat, Taping, and Manual therapy.  PLAN FOR NEXT SESSION: see pt clinical impression     GOALS: Goals reviewed with patient? Yes  SHORT TERM GOALS: Target date: 11/01/2021  Pt wqill be IND with HEP Baseline: not  IND  Goal status: INITIAL   LONG TERM GOALS: Target date: 12/13/2021  Pt will demo > 5 pt change on FOTO  to improve QOL and function Urinary Problem baseline Pelvic Pain baseline Bowel baseline   Baseline:  Goal status: INITIAL  2.  Pt will report feeling complete with urination and not have to  return again to toilet across 2/3 trips in order to perform work dutes  Baseline: feel incomplete 30-40 % of the time  Goal status: INITIAL  3.  Pt will report being able to sit and stand for > 30 min without hip flexor pain in order to play with kids and work  Baseline: 20 mins onset  Goal status: INITIAL  4.  Pt will have no B knee pain with descending stairs  Baseline: pain Goal status: INITIAL  5.  Pt will demo levelled pelvic girdle and shoulder height in order to progress to deep core strengthening HEP and restore mobility at spine, pelvis, gait, posture to practice yoga with less risk for injuries   Baseline: standing: R iliac higher., supine: L ASIS / medial malleoli higher  ( Post Tx, 09/18/21: supine: levelled ASIS, standing R iliac crest still higher)  Goal status: INITIAL  6.  Pt will demo increased gait speed t o> 1.34 m/s in order to maintain walking routine Baseline: 1.19 m/s  Goal status: INITIAL   Mariane Masters, PT 10/04/2021, 3:09 PM

## 2021-10-04 NOTE — Patient Instructions (Signed)
   During the day:  Stretches : (Cuing provided for proper alignment6 directions After holding child on L hip:  Sidestretch by wall ( stand perpendicular),  11 -2 o'clock sweeps  To lengthen side body  ___   kitchen counter stretches  Hands on kitchen counter,   Palms shoulder width apart  Minisquat postion Trunk is parallel to floor  A) Pull buttocks back to lengthen spine, knees bent  3 breaths   B) Bring R hand to the L, and stretch the R side trunk  3 breaths   Brings hands to center again Do the same to the L side stretch by placing L hand on top of R   D) Modified thread the needle R hand on L thigh, L  thigh pushing out slightly as the R hands pull in,  elbow bent and pulls to theR,  Look under L armpit   Do the same to other side  ____   Wear wider shoes Lift knees higher, navel more forward over knees, feel balllmounds support as you land , less on heels

## 2021-10-11 ENCOUNTER — Ambulatory Visit: Payer: BC Managed Care – PPO | Attending: Physician Assistant | Admitting: Physical Therapy

## 2021-10-11 DIAGNOSIS — R278 Other lack of coordination: Secondary | ICD-10-CM | POA: Diagnosis not present

## 2021-10-11 DIAGNOSIS — M533 Sacrococcygeal disorders, not elsewhere classified: Secondary | ICD-10-CM | POA: Diagnosis not present

## 2021-10-11 DIAGNOSIS — R2689 Other abnormalities of gait and mobility: Secondary | ICD-10-CM | POA: Insufficient documentation

## 2021-10-11 NOTE — Patient Instructions (Signed)
     WALKING WITH RESISTANCE BLUE Band at waist connected to doorknob Stepping forward normal length steps, planting mid and forefoot down, center of mass ( navel) leans forward slightly as if you were walking uphill 3-4 steps till band feels taut ( MAKE SURE THE DOOR IS LOCKED AND WON'T OPEN)   Stepping backwards, lower heel slowly, carry trunk and hips back , leaning forward, front knee along 2-3 rd toe line    2 min  ____      Feet slides :  GREEN bands at the thigh to keep knee out    Points of contact at sitting bones  Four points of contact of foot,  Side knee back while keeping knee out along 2-3rd toe line   Heel up, ankle not twist out Lower heel while keeping knee out along 2-3rd toe line Four points of contact of foot, Slide foot back while keeping knee out along 2-3rd toe line   Repeated with other foot

## 2021-10-11 NOTE — Therapy (Addendum)
OUTPATIENT PHYSICAL THERAPY Treatment   Patient Name: Christie [name] MRN: [id number] DOB:November 05, 1985, 36 y.o., female Today's Date: 10/11/2021   PT End of Session - 10/11/21 1512     Visit Number 4    Number of Visits 10    Date for PT Re-Evaluation 11/27/21    PT Start Time 1508    PT Stop Time 1550    PT Time Calculation (min) 42 min             Past Medical History:  Diagnosis Date   GERD (gastroesophageal reflux disease)    History of gestational hypertension    Pregnancy 01/17/2017   Pregnancy induced hypertension    Past Surgical History:  Procedure Laterality Date   NO PAST SURGERIES     WISDOM TOOTH EXTRACTION     Patient Active Problem List   Diagnosis Date Noted   Gross hematuria 07/27/2021   Dysuria 07/27/2021   Generalized abdominal pain 06/27/2021   Slow transit constipation 03/23/2021   Non-recurrent acute suppurative otitis media of left ear without spontaneous rupture of tympanic membrane 03/23/2021   Concussion 06/10/2020   Biological false positive RPR test 07/04/2016   Hernia, hiatal 10/01/2014   GERD (gastroesophageal reflux disease) 10/01/2014      REFERRING PROVIDER: Florina Ou   REFERRING DIAG:  M62.89 (ICD-10-CM) - Pelvic floor dysfunction  Rationale for Evaluation and Treatment Rehabilitation  THERAPY DIAG:  Sacrococcygeal disorders, not elsewhere classified  Other lack of coordination  Other abnormalities of gait and mobility  ONSET DATE: 5 years ago after 1st child   SUBJECTIVE:                                                                                                                                                                                           SUBJECTIVE STATEMENT: Pt reported noticing her pelvic pain less. Pt's R hip pain improved 50% improvement .  Pt notices a difference between her R and L foot. Pt noticed R foot is looser.    PERTINENT HISTORY:  Gynecological Hx: 1st vaginal delivery with 2-3  perineal stitches, 2nd vaginal and no tearing. Pt had elevated HB during pregnancy and had induced labor. Second pregnancy with LBP and "hip flexor" pain. Pt enjoys yoga and wants to get back to yoga.     PRECAUTIONS: None  WEIGHT BEARING RESTRICTIONS No  FALLS:  Has patient fallen in last 6 months? No  LIVING ENVIRONMENT: Lives with: lives with their family Lives in: House/apartment Stairs: Yes: External: 3 steps; rail  Has following equipment at home: None  OCCUPATION:  Chiropodist ( moderate sedentary job)   PLOF:  Independent  PATIENT GOALS                Decrease hip flexor and pelvic pain and return to yoga    OBJECTIVE:     [facility] PT Assessment - 10/11/21 1631       Palpation   Palpation comment tightness  toe ext/ abduction DF/EV on L      Ambulation/Gait   Gait Comments supination, poor ecentric control with backward walking, more anteriorCOM,              [facility] Adult PT Treatment/Exercise - 10/11/21 1630       Neuro Re-ed    Neuro Re-ed Details  cued for more transverse arch activation and less supination with new HEP, cued for propioception for COM over midfoot ( see pt instructions)      Manual Therapy   Manual therapy comments STM/MWM at areas noted in assessment to promote toe ext/ abduction DF/EV on L               HOME EXERCISE PROGRAM: See pt instruction section    ASSESSMENT:  CLINICAL IMPRESSION: Pt's R foot showed more mobility but manual Tx was required for L foot. Pt required cues for less supination and more propioception of lower  kinetic chain to promote more pelvic girdle stability and SIJ mobility. Pt was explained the regional interdependent approach to help with her hip and pelvic pain by addressing her feet mobility and gait mechanics. Pt demo'd improved more anterior COM and less supination post training today which will help with her goals. Plan to progress to more feet and lower kinetic chain awareness. Alignment in  standing yoga poses at next session.   Pt benefits from skilled PT.    OBJECTIVE IMPAIRMENTS Abnormal gait, decreased activity tolerance, decreased coordination, decreased endurance, decreased mobility, difficulty walking, decreased strength, decreased safety awareness, hypomobility, increased fascial restrictions, increased muscle spasms, impaired flexibility, improper body mechanics, postural dysfunction, and pain.   ACTIVITY LIMITATIONS sitting, sleeping, continence, and toileting  PARTICIPATION LIMITATIONS: interpersonal relationship, community activity, and occupation  PERSONAL FACTORS  Past/current experiences are also affecting patient's functional outcome.   REHAB POTENTIAL: Good  CLINICAL DECISION MAKING: Evolving/moderate complexity  EVALUATION COMPLEXITY: Moderate   PATIENT EDUCATION:   Education details: Showed pt anatomy images. Explained muscles attachments/ connection, physiology of deep core system/ spinal- thoracic-pelvis-lower kinetic chain as they relate to pt's presentation, Sx, and past Hx. Explained what and how these areas of deficits need to be restored to balance and function   See clinical impression  Person educated: Patient Education method: Explanation, Demonstration, Tactile cues, Verbal cues, and Handouts Education comprehension: verbalized understanding, returned demonstration, verbal cues required, tactile cues required, and needs further education   PLAN: PT FREQUENCY: 1x/week  PT DURATION: 10 weeks  PLANNED INTERVENTIONS: Therapeutic exercises, Therapeutic activity, Neuromuscular re-education, Balance training, Gait training, Patient/Family education, Joint mobilization, Dry Needling, Spinal mobilization, Cryotherapy, Moist heat, Taping, and Manual therapy.  PLAN FOR NEXT SESSION: see pt clinical impression     GOALS: Goals reviewed with patient? Yes  SHORT TERM GOALS: Target date: 11/08/2021  Pt wqill be IND with HEP Baseline: not IND   Goal status: INITIAL   LONG TERM GOALS: Target date: 12/20/2021  Pt will demo > 5 pt change on FOTO  to improve QOL and function Urinary Problem baseline Pelvic Pain baseline Bowel baseline   Baseline:  Goal status: INITIAL  2.  Pt will report feeling complete with urination and not have  to return again to toilet across 2/3 trips in order to perform work dutes  Baseline: feel incomplete 30-40 % of the time  Goal status: INITIAL  3.  Pt will report being able to sit and stand for > 30 min without hip flexor pain in order to play with kids and work  Baseline: 20 mins onset  Goal status: INITIAL  4.  Pt will have no B knee pain with descending stairs  Baseline: pain Goal status: INITIAL  5.  Pt will demo levelled pelvic girdle and shoulder height in order to progress to deep core strengthening HEP and restore mobility at spine, pelvis, gait, posture to practice yoga with less risk for injuries   Baseline: standing: R iliac higher., supine: L ASIS / medial malleoli higher  ( Post Tx, 09/18/21: supine: levelled ASIS, standing R iliac crest still higher)  Goal status: INITIAL  6.  Pt will demo increased gait speed t o> 1.34 m/s in order to maintain walking routine Baseline: 1.19 m/s  Goal status: INITIAL   Mariane Masters, PT 10/11/2021, 4:38 PM

## 2021-10-17 DIAGNOSIS — F411 Generalized anxiety disorder: Secondary | ICD-10-CM | POA: Diagnosis not present

## 2021-10-18 ENCOUNTER — Ambulatory Visit: Payer: BC Managed Care – PPO | Admitting: Physical Therapy

## 2021-10-18 DIAGNOSIS — M533 Sacrococcygeal disorders, not elsewhere classified: Secondary | ICD-10-CM

## 2021-10-18 DIAGNOSIS — R2689 Other abnormalities of gait and mobility: Secondary | ICD-10-CM

## 2021-10-18 DIAGNOSIS — R278 Other lack of coordination: Secondary | ICD-10-CM

## 2021-10-18 NOTE — Therapy (Addendum)
OUTPATIENT PHYSICAL THERAPY Treatment   Patient Name: Christie [name] MRN: [id number] DOB:[birth year], 36 y.o., female Today's Date: 10/18/2021   PT End of Session - 10/18/21 1506     Visit Number 5    Number of Visits 10    Date for PT Re-Evaluation 11/27/21    PT Start Time 1503    PT Stop Time 1545    PT Time Calculation (min) 42 min    Activity Tolerance Patient tolerated treatment well             Past Medical History:  Diagnosis Date   GERD (gastroesophageal reflux disease)    History of gestational hypertension    Pregnancy 01/17/2017   Pregnancy induced hypertension    Past Surgical History:  Procedure Laterality Date   NO PAST SURGERIES     WISDOM TOOTH EXTRACTION     Patient Active Problem List   Diagnosis Date Noted   Gross hematuria 07/27/2021   Dysuria 07/27/2021   Generalized abdominal pain 06/27/2021   Slow transit constipation 03/23/2021   Non-recurrent acute suppurative otitis media of left ear without spontaneous rupture of tympanic membrane 03/23/2021   Concussion 06/10/2020   Biological false positive RPR test 07/04/2016   Hernia, hiatal 10/01/2014   GERD (gastroesophageal reflux disease) 10/01/2014      REFERRING PROVIDER: Minda Ditto   REFERRING DIAG:  M62.89 (ICD-10-CM) - Pelvic floor dysfunction  Rationale for Evaluation and Treatment Rehabilitation  THERAPY DIAG:  Other lack of coordination  Sacrococcygeal disorders, not elsewhere classified  Other abnormalities of gait and mobility  ONSET DATE: 5 years ago after 1st child   SUBJECTIVE:                                                                                                                                                                                           SUBJECTIVE STATEMENT: Pt reports her LBP and R hip/ groin pain has improved by 60-70% .  Pt has noticed her IBS Sx are improving.   PERTINENT HISTORY:  Gynecological Hx: 1st vaginal delivery with 2-3  perineal stitches, 2nd vaginal and no tearing. Pt had elevated HB during pregnancy and had induced labor. Second pregnancy with LBP and "hip flexor" pain. Pt enjoys yoga and wants to get back to yoga.      PRECAUTIONS: None  WEIGHT BEARING RESTRICTIONS No  FALLS:  Has patient fallen in last 6 months? No  LIVING ENVIRONMENT: Lives with: lives with their family Lives in: House/apartment Stairs: Yes: External: 3 steps; rail  Has following equipment at home: None  OCCUPATION:  Surveyor, quantity ( moderate sedentary job)   PLOF:  Independent  PATIENT GOALS                Decrease hip flexor and pelvic pain and return to yoga    OBJECTIVE:     [facility] PT Assessment - 10/18/21 1517       Coordination   Coordination and Movement Description straining with oblique mm      Palpation   Palpation comment hypomobile intermediate cuneiform, post tib distal and proximal attachments, tenderness at dorsum , digit II-III  limited mobility      Ambulation/Gait   Gait Comments 1.48 m/s ( reciporcal gait pattern, stronger push off               [facility] Adult PT Treatment/Exercise - 10/18/21 1517       Neuro Re-ed    Neuro Re-ed Details  cued for less  abdominal overuse, deep core level 1-2      Manual Therapy   Manual therapy comments STM/MWM at areas noted in assessment to promote toe ext/ abduction DF/EV on R                HOME EXERCISE PROGRAM: See pt instruction section    ASSESSMENT:  CLINICAL IMPRESSION: Pt reports her LBP and R hip/ groin pain has improved by 60-70% . Pt continued to require manual Tx to increase feet/ankle mobility in RLE. Anticipate more ankle mobility will help with knee pain during stairs navigation.   Progressed to deep core corrdination which pt required cues for less abdominal overuse. Pt demo'd properly post training.  Plan to continue address lower kinetic chain and reassess technique with deep core next session.   Pt benefits from  skilled PT.    OBJECTIVE IMPAIRMENTS Abnormal gait, decreased activity tolerance, decreased coordination, decreased endurance, decreased mobility, difficulty walking, decreased strength, decreased safety awareness, hypomobility, increased fascial restrictions, increased muscle spasms, impaired flexibility, improper body mechanics, postural dysfunction, and pain.   ACTIVITY LIMITATIONS sitting, sleeping, continence, and toileting  PARTICIPATION LIMITATIONS: interpersonal relationship, community activity, and occupation  PERSONAL FACTORS  Past/current experiences are also affecting patient's functional outcome.   REHAB POTENTIAL: Good  CLINICAL DECISION MAKING: Evolving/moderate complexity  EVALUATION COMPLEXITY: Moderate   PATIENT EDUCATION:   Education details: Showed pt anatomy images. Explained muscles attachments/ connection, physiology of deep core system/ spinal- thoracic-pelvis-lower kinetic chain as they relate to pt's presentation, Sx, and past Hx. Explained what and how these areas of deficits need to be restored to balance and function   See clinical impression  Person educated: Patient Education method: Explanation, Demonstration, Tactile cues, Verbal cues, and Handouts Education comprehension: verbalized understanding, returned demonstration, verbal cues required, tactile cues required, and needs further education   PLAN: PT FREQUENCY: 1x/week  PT DURATION: 10 weeks  PLANNED INTERVENTIONS: Therapeutic exercises, Therapeutic activity, Neuromuscular re-education, Balance training, Gait training, Patient/Family education, Joint mobilization, Dry Needling, Spinal mobilization, Cryotherapy, Moist heat, Taping, and Manual therapy.  PLAN FOR NEXT SESSION: see pt clinical impression     GOALS: Goals reviewed with patient? Yes  SHORT TERM GOALS: Target date: 11/15/2021  Pt wqill be IND with HEP Baseline: not IND  Goal status: INITIAL   LONG TERM GOALS: Target date:  12/27/2021  Pt will demo > 5 pt change on FOTO  to improve QOL and function Urinary Problem baseline  21 pts --> 0 pts  Pelvic Pain baseline 25 pts  --> 8 pts 10/18/21  Bowel baseline  8pts --> 0 pts    Baseline: see above  Goal  status: Achieved   2.  Pt will report feeling complete with urination and not have to return again to toilet across 2/3 trips in order to perform work dutes  Baseline: feel incomplete 30-40 % of the time  Goal status: INITIAL  3.  Pt will report being able to sit and stand for > 30 min without hip flexor pain in order to play with kids and work  Baseline: 20 mins onset  Goal status:partially met   4.  Pt will have no B knee pain with descending stairs  Baseline: pain Goal status:  partially met   5.  Pt will demo levelled pelvic girdle and shoulder height in order to progress to deep core strengthening HEP and restore mobility at spine, pelvis, gait, posture to practice yoga with less risk for injuries   Baseline: standing: R iliac higher., supine: L ASIS / medial malleoli higher  ( Post Tx, 09/18/21: supine: levelled ASIS, standing R iliac crest still higher)  Goal status: Achieved   6.  Pt will demo increased gait speed t o> 1.34 m/s in order to maintain walking routine Baseline: 1.19 m/s  Goal status:Achieved ( 10/18/21: 1.48 m/s)    Jerl Mina, PT 10/18/2021, 3:45 PM

## 2021-10-25 ENCOUNTER — Ambulatory Visit: Payer: BC Managed Care – PPO | Admitting: Physical Therapy

## 2021-10-25 DIAGNOSIS — M533 Sacrococcygeal disorders, not elsewhere classified: Secondary | ICD-10-CM | POA: Diagnosis not present

## 2021-10-25 DIAGNOSIS — R2689 Other abnormalities of gait and mobility: Secondary | ICD-10-CM

## 2021-10-25 DIAGNOSIS — R278 Other lack of coordination: Secondary | ICD-10-CM

## 2021-10-25 NOTE — Therapy (Addendum)
OUTPATIENT PHYSICAL THERAPY Treatment   Patient Name: Christie [name] MRN: [id number] DOB:[birth year], 36 y.o., female Today's Date: 10/25/2021   PT End of Session - 10/25/21 1506     Visit Number 6    Number of Visits 10    Date for PT Re-Evaluation 11/27/21    PT Start Time 1502    PT Stop Time 1545    PT Time Calculation (min) 43 min    Activity Tolerance Patient tolerated treatment well             Past Medical History:  Diagnosis Date   GERD (gastroesophageal reflux disease)    History of gestational hypertension    Pregnancy 01/17/2017   Pregnancy induced hypertension    Past Surgical History:  Procedure Laterality Date   NO PAST SURGERIES     WISDOM TOOTH EXTRACTION     Patient Active Problem List   Diagnosis Date Noted   Gross hematuria 07/27/2021   Dysuria 07/27/2021   Generalized abdominal pain 06/27/2021   Slow transit constipation 03/23/2021   Non-recurrent acute suppurative otitis media of left ear without spontaneous rupture of tympanic membrane 03/23/2021   Concussion 06/10/2020   Biological false positive RPR test 07/04/2016   Hernia, hiatal 10/01/2014   GERD (gastroesophageal reflux disease) 10/01/2014      REFERRING PROVIDER: Minda Ditto   REFERRING DIAG:  M62.89 (ICD-10-CM) - Pelvic floor dysfunction  Rationale for Evaluation and Treatment Rehabilitation  THERAPY DIAG:  Other lack of coordination  Sacrococcygeal disorders, not elsewhere classified  Other abnormalities of gait and mobility  ONSET DATE: 5 years ago after 1st child   SUBJECTIVE:                                                                                                                                                                                           SUBJECTIVE STATEMENT: Pt reports she is noticing the pressure sensation. Pt 's LBP and hip pain is not has noticeable.   PERTINENT HISTORY:  Gynecological Hx: 1st vaginal delivery with 2-3 perineal  stitches, 2nd vaginal and no tearing. Pt had elevated HB during pregnancy and had induced labor. Second pregnancy with LBP and "hip flexor" pain. Pt enjoys yoga and wants to get back to yoga.       PRECAUTIONS: None  WEIGHT BEARING RESTRICTIONS No  FALLS:  Has patient fallen in last 6 months? No  LIVING ENVIRONMENT: Lives with: lives with their family Lives in: House/apartment Stairs: Yes: External: 3 steps; rail  Has following equipment at home: None  OCCUPATION:  Surveyor, quantity ( moderate sedentary job)   PLOF: [place]  Decrease hip flexor and pelvic pain and return to yoga    OBJECTIVE:    [facility] PT Assessment - 10/26/21 1736       Palpation   Palpation comment hypomobile B intermediate cuneiform, post tib distal and proximal attachments, tenderness at dorsum , digit II-III  limited mobility R    Observation:     poor alignment of knee and feet in RDL exercise            [facility] Adult PT Treatment/Exercise - 10/26/21 1743       Neuro Re-ed    Neuro Re-ed Details  cued for lower kinetic chain strengthening of foot arch/ knee alignment      Manual Therapy   Manual therapy comments STM/MWM at areas noted in assessment to promote toe ext/ abduction DF/EV on R                HOME EXERCISE PROGRAM: See pt instruction section    ASSESSMENT:  CLINICAL IMPRESSION: Pt continued to required more manual Tx to further promote DF/EV, toe abduction and cues for more knee ER and lifted arches in RLD exercise. Plan to add yoga sequence with more feet/ knee / hip propioception and alignment awareness to help pt integrate back to yoga. Add deep core HEP at next session.   Pt benefits from skilled PT.    OBJECTIVE IMPAIRMENTS Abnormal gait, decreased activity tolerance, decreased coordination, decreased endurance, decreased mobility, difficulty walking, decreased strength, decreased safety awareness, hypomobility, increased fascial  restrictions, increased muscle spasms, impaired flexibility, improper body mechanics, postural dysfunction, and pain.   ACTIVITY LIMITATIONS sitting, sleeping, continence, and toileting  PARTICIPATION LIMITATIONS: interpersonal relationship, community activity, and occupation  PERSONAL FACTORS  Past/current experiences are also affecting patient's functional outcome.   REHAB POTENTIAL: Good  CLINICAL DECISION MAKING: Evolving/moderate complexity  EVALUATION COMPLEXITY: Moderate   PATIENT EDUCATION:   Education details: Showed pt anatomy images. Explained muscles attachments/ connection, physiology of deep core system/ spinal- thoracic-pelvis-lower kinetic chain as they relate to pt's presentation, Sx, and past Hx. Explained what and how these areas of deficits need to be restored to balance and function   See clinical impression  Person educated: Patient Education method: Explanation, Demonstration, Tactile cues, Verbal cues, and Handouts Education comprehension: verbalized understanding, returned demonstration, verbal cues required, tactile cues required, and needs further education   PLAN: PT FREQUENCY: 1x/week  PT DURATION: 10 weeks  PLANNED INTERVENTIONS: Therapeutic exercises, Therapeutic activity, Neuromuscular re-education, Balance training, Gait training, Patient/Family education, Joint mobilization, Dry Needling, Spinal mobilization, Cryotherapy, Moist heat, Taping, and Manual therapy.  PLAN FOR NEXT SESSION: see pt clinical impression     GOALS: Goals reviewed with patient? Yes  SHORT TERM GOALS: Target date: 11/22/2021  Pt wqill be IND with HEP Baseline: not IND  Goal status: INITIAL   LONG TERM GOALS: Target date: 01/03/2022  Pt will demo > 5 pt change on FOTO  to improve QOL and function Urinary Problem baseline  21 pts --> 0 pts  Pelvic Pain baseline 25 pts  --> 8 pts 10/18/21  Bowel baseline  8pts --> 0 pts    Baseline: see above  Goal status:  Achieved   2.  Pt will report feeling complete with urination and not have to return again to toilet across 2/3 trips in order to perform work dutes  Baseline: feel incomplete 30-40 % of the time  Goal status: INITIAL  3.  Pt will report being able to sit and stand for > 30  min without hip flexor pain in order to play with kids and work  Baseline: 20 mins onset  Goal status:partially met   4.  Pt will have no B knee pain with descending stairs  Baseline: pain Goal status:  partially met   5.  Pt will demo levelled pelvic girdle and shoulder height in order to progress to deep core strengthening HEP and restore mobility at spine, pelvis, gait, posture to practice yoga with less risk for injuries   Baseline: standing: R iliac higher., supine: L ASIS / medial malleoli higher  ( Post Tx, 09/18/21: supine: levelled ASIS, standing R iliac crest still higher)  Goal status: Achieved   6.  Pt will demo increased gait speed t o> 1.34 m/s in order to maintain walking routine Baseline: 1.19 m/s  Goal status:Achieved ( 10/18/21: 1.48 m/s)    Jerl Mina, PT 10/25/2021, 3:07 PM [place] [facility] SERVICES [address] [place], [place], [address] Phone: [phone]   Fax:  [phone]  Physical Therapy Treatment  Patient Details  Name: Christie [name] MRN: [id number] Date of Birth: 04/02/1985 No data recorded  Encounter Date: 10/25/2021   PT End of Session - 10/25/21 1506     Visit Number 6    Number of Visits 10    Date for PT Re-Evaluation 11/27/21    PT Start Time 1502    PT Stop Time 5436    PT Time Calculation (min) 43 min    Activity Tolerance Patient tolerated treatment well             Past Medical History:  Diagnosis Date   GERD (gastroesophageal reflux disease)    History of gestational hypertension    Pregnancy 01/17/2017   Pregnancy induced hypertension     Past Surgical History:  Procedure Laterality Date   NO PAST  SURGERIES     WISDOM TOOTH EXTRACTION      There were no vitals filed for this visit.                                            Patient will benefit from skilled therapeutic intervention in order to improve the following deficits and impairments:     Visit Diagnosis: Other lack of coordination  Sacrococcygeal disorders, not elsewhere classified  Other abnormalities of gait and mobility     Problem List Patient Active Problem List   Diagnosis Date Noted   Gross hematuria 07/27/2021   Dysuria 07/27/2021   Generalized abdominal pain 06/27/2021   Slow transit constipation 03/23/2021   Non-recurrent acute suppurative otitis media of left ear without spontaneous rupture of tympanic membrane 03/23/2021   Concussion 06/10/2020   Biological false positive RPR test 07/04/2016   Hernia, hiatal 10/01/2014   GERD (gastroesophageal reflux disease) 10/01/2014    Jerl Mina, PT 10/25/2021, 3:07 PM  [place] [facility] SERVICES [address] [place], [place], [address] Phone: [phone]   Fax:  [phone]  Name: Christie [name] MRN: [id number] Date of Birth: 1985-12-31

## 2021-10-25 NOTE — Patient Instructions (Addendum)
Strengthening feet arches:    Heel raises - heels together, minisquat  Minisquat motion, trunk bent , gaze onto floor like you are looking at your reflection over a lake/pond,  Knees bent pointed out like a "v" , navel ( center of mass) more forward  Heels together as you lift, pointed out like a "v"  KNEES ARE ALIGNED BEHIND THE TOES TO MINIMIZE STRAIN ON THE KNEES your  navel ( center of mass) more forward to a avoid dropping down fast and rocking more weight back onto heels , keep heels pressing against each other the whole time   10 reps  ________________    Letter T, [place] on one leg with band band under L foot, wrap by big toe then, outer knee/ thigh, L hand pulling ( elbow by side)  Plant ballmound, toes spread, thigh out against band,, tracking knee in line with 2-3rd toe line Navel over knee , knee over ballmound  Dipping forward, R foot lifts slight ( whole body like a see saw) off floor or  Press back foot against wall 30 reps  Both     ___  Feet care :  Self -feet massage   Handshake : fingers between toes, moving ballmounds/toes back and forth several times while other hand anchors at arch. Do the same at the hind/mid foot.  Heel to toes upward to a letter Big Letter T strokes to spread ballmounds and toes, several times, pinch between webs of toes  Run finger tips along top of foot between long bones "comb between the bones"    Wiggle toes and spread them out when relaxing    __  R clamshells  20 reps          Deep core level 1 ( 10 breaths)  2 ( 6 min)            Minisquat heel press  ( see above)          Letter T ,see saw ( see above)          Feet massage

## 2021-10-30 ENCOUNTER — Ambulatory Visit: Payer: BC Managed Care – PPO | Admitting: Physical Therapy

## 2021-10-30 DIAGNOSIS — R278 Other lack of coordination: Secondary | ICD-10-CM

## 2021-10-30 DIAGNOSIS — M533 Sacrococcygeal disorders, not elsewhere classified: Secondary | ICD-10-CM

## 2021-10-30 DIAGNOSIS — R2689 Other abnormalities of gait and mobility: Secondary | ICD-10-CM

## 2021-10-30 NOTE — Therapy (Addendum)
OUTPATIENT PHYSICAL THERAPY Treatment   Patient Name: Christie [name] MRN: [id number] DOB:1985-10-23, 36 y.o., female Today's Date: 10/30/2021   PT End of Session - 10/30/21 1338     Visit Number 7    Number of Visits 10    Date for PT Re-Evaluation 11/27/21    PT Start Time 1224    PT Stop Time 1420    PT Time Calculation (min) 43 min    Activity Tolerance Patient tolerated treatment well             Past Medical History:  Diagnosis Date   GERD (gastroesophageal reflux disease)    History of gestational hypertension    Pregnancy 01/17/2017   Pregnancy induced hypertension    Past Surgical History:  Procedure Laterality Date   NO PAST SURGERIES     WISDOM TOOTH EXTRACTION     Patient Active Problem List   Diagnosis Date Noted   Gross hematuria 07/27/2021   Dysuria 07/27/2021   Generalized abdominal pain 06/27/2021   Slow transit constipation 03/23/2021   Non-recurrent acute suppurative otitis media of left ear without spontaneous rupture of tympanic membrane 03/23/2021   Concussion 06/10/2020   Biological false positive RPR test 07/04/2016   Hernia, hiatal 10/01/2014   GERD (gastroesophageal reflux disease) 10/01/2014      REFERRING PROVIDER: Minda Ditto   REFERRING DIAG:  M62.89 (ICD-10-CM) - Pelvic floor dysfunction  Rationale for Evaluation and Treatment Rehabilitation  THERAPY DIAG:  Other lack of coordination  Sacrococcygeal disorders, not elsewhere classified  Other abnormalities of gait and mobility  ONSET DATE: 5 years ago after 1st child   SUBJECTIVE:                                                                                                                                                                                           SUBJECTIVE STATEMENT: Pt reports she is noticing the pressure sensation. Pt 's LBP and hip pain is not has noticeable.   PERTINENT HISTORY:  Gynecological Hx: 1st vaginal delivery with 2-3 perineal  stitches, 2nd vaginal and no tearing. Pt had elevated HB during pregnancy and had induced labor. Second pregnancy with LBP and "hip flexor" pain. Pt enjoys yoga and wants to get back to yoga.      PRECAUTIONS: None  WEIGHT BEARING RESTRICTIONS No  FALLS:  Has patient fallen in last 6 months? No  LIVING ENVIRONMENT: Lives with: lives with their family Lives in: House/apartment Stairs: Yes: External: 3 steps; rail  Has following equipment at home: None  OCCUPATION:  Surveyor, quantity ( moderate sedentary job)   PLOF: [place]  Decrease hip flexor and pelvic pain and return to yoga    OBJECTIVE:   [facility] PT Assessment - 10/30/21 1420       Coordination   Coordination and Movement Description knee misalignment in warrior poses, collapsed arches             Pelvic Floor Special Questions - 10/30/21 1430     External Perineal Exam THrough clothing: increased tightness along L psoterior mm > R              [facility] Adult PT Treatment/Exercise - 10/26/21 1743       Neuro Re-ed    Neuro Re-ed Details  cued for lower kinetic chain strengthening of foot arch/ knee alignment      Manual Therapy   Manual therapy comments STM/MWM at areas noted in assessment to promote toe ext/ abduction DF/EV on R                HOME EXERCISE PROGRAM: See pt instruction section    ASSESSMENT:  CLINICAL IMPRESSION: Pt required manual Tx to decrease pelvic floor mm tightness through external techniques. Cued for pelvic floor stretches. Educated pt on principles of lower kinetic chain with standing yoga poses to increase propioception/ alignment to minimize collapsed arches/ hyperextended knees, overuse of upper traps which were her past deficit patterns. Pt demo'd proper form and alignment post Tx. Plan to add deep core HEP at next session.   Pt benefits from skilled PT.    OBJECTIVE IMPAIRMENTS Abnormal gait, decreased activity tolerance,  decreased coordination, decreased endurance, decreased mobility, difficulty walking, decreased strength, decreased safety awareness, hypomobility, increased fascial restrictions, increased muscle spasms, impaired flexibility, improper body mechanics, postural dysfunction, and pain.   ACTIVITY LIMITATIONS sitting, sleeping, continence, and toileting  PARTICIPATION LIMITATIONS: interpersonal relationship, community activity, and occupation  PERSONAL FACTORS  Past/current experiences are also affecting patient's functional outcome.   REHAB POTENTIAL: Good  CLINICAL DECISION MAKING: Evolving/moderate complexity  EVALUATION COMPLEXITY: Moderate   PATIENT EDUCATION:   Education details: Showed pt anatomy images. Explained muscles attachments/ connection, physiology of deep core system/ spinal- thoracic-pelvis-lower kinetic chain as they relate to pt's presentation, Sx, and past Hx. Explained what and how these areas of deficits need to be restored to balance and function   See clinical impression  Person educated: Patient Education method: Explanation, Demonstration, Tactile cues, Verbal cues, and Handouts Education comprehension: verbalized understanding, returned demonstration, verbal cues required, tactile cues required, and needs further education   PLAN: PT FREQUENCY: 1x/week  PT DURATION: 10 weeks  PLANNED INTERVENTIONS: Therapeutic exercises, Therapeutic activity, Neuromuscular re-education, Balance training, Gait training, Patient/Family education, Joint mobilization, Dry Needling, Spinal mobilization, Cryotherapy, Moist heat, Taping, and Manual therapy.  PLAN FOR NEXT SESSION: see pt clinical impression     GOALS: Goals reviewed with patient? Yes  SHORT TERM GOALS: Target date: 11/27/2021  Pt wqill be IND with HEP Baseline: not IND  Goal status: INITIAL   LONG TERM GOALS: Target date: 01/08/2022  Pt will demo > 5 pt change on FOTO  to improve QOL and  function Urinary Problem baseline  21 pts --> 0 pts  Pelvic Pain baseline 25 pts  --> 8 pts 10/18/21  Bowel baseline  8pts --> 0 pts    Baseline: see above  Goal status: Achieved   2.  Pt will report feeling complete with urination and not have to return again to toilet across 2/3 trips in order to perform work dutes  Baseline: feel incomplete 30-40 %  of the time  Goal status: INITIAL  3.  Pt will report being able to sit and stand for > 30 min without hip flexor pain in order to play with kids and work  Baseline: 20 mins onset  Goal status:partially met   4.  Pt will have no B knee pain with descending stairs  Baseline: pain Goal status:  partially met   5.  Pt will demo levelled pelvic girdle and shoulder height in order to progress to deep core strengthening HEP and restore mobility at spine, pelvis, gait, posture to practice yoga with less risk for injuries   Baseline: standing: R iliac higher., supine: L ASIS / medial malleoli higher  ( Post Tx, 09/18/21: supine: levelled ASIS, standing R iliac crest still higher)  Goal status: Achieved   6.  Pt will demo increased gait speed t o> 1.34 m/s in order to maintain walking routine Baseline: 1.19 m/s  Goal status:Achieved ( 10/18/21: 1.48 m/s)    Jerl Mina, PT 10/30/2021, 2:32 PM [place] MAIN [facility] SERVICES [address] [place], [place], [address] Phone: [phone]   Fax:  [phone]

## 2021-10-30 NOTE — Patient Instructions (Signed)
Stretch for pelvic floor   V- slides  "v heels slide away and then back toward buttocks and then rock knee to slight ,  slide heel along at 11 o clock away from buttocks   10 reps    On belly: Riding horse edge of mattress  knee bent like riding a horse, move knee towards armpit and out  10 reps    childs poses rocking   Toes tucked, shoulders down and back, on forearms , hands shoulder width apart  10 reps     Mermaid stretch  Rocking while seated on the floor with heels to one side of the hip Heels to one side of the hip  Rock forward towards the knee that is bent , rock beck towards the opposite sitting bones  __ [employer]  - hands in a v positoin to minimzie upper trap, focus on your feet  Straight back ( knees bent like a squat, pushing hands against thighs, thighs against hands)   Chair pose    Warrior I : Feet are hip width apart, one behind like you are on ski tracks, front knee bent over ankle but not more forward then the ankle.  Make sure 50% weight is in the front foot/leg , 50% weight is the back foot/ leg    Arms up like "V" , drop shoulders down  Palms together at the hears   3 breaths here.    Warrior II:  Raise arm up in front on the same side as your front knee, back arm up behind. Arms are aligned with the length of the mat Inhale,  Exhale and relax shoulders and ribcage  Make sure 50% weight is in the front foot/leg , 50% weight is the back foot/ leg   3 breaths here.     Extended side angle :   Feet are hip width apart, L foot one behind like you are on ski tracks,  R knee bent over ankle but not more forward then the ankle.  Make sure 50% weight is in the front foot/leg , 50% weight is the back foot/ leg    Rest R forearm lightly on top of thigh,  L hand on L hip.  Inhale lengthen spine,   Exhale turn navel to the L then the ribcage turns, look at the other wall.  Keep maintaining  50% weight is in the front foot/leg , 50% weight  is the back foot/ leg  And make sure the front knee is still pointed in the toe line of the 2nd toe.    3 breaths here.      [employer] planning/management officer III:     Unlock knee of standing leg - hands by hips, shoulders down down away from ears  Foot pointed perpendicular to ground og leg in the air, hips squared to floor, TO AVOID LOW BACK PAIN, LIFT LEG AT A  LESS ANGLE FROM THE FLOOR and KEEP GAZE DOWN TO NOT PUFF OUT THE CHEST   Three breaths here

## 2021-11-07 ENCOUNTER — Encounter: Payer: BC Managed Care – PPO | Admitting: Physical Therapy

## 2021-11-17 ENCOUNTER — Ambulatory Visit: Payer: BC Managed Care – PPO | Attending: Physician Assistant | Admitting: Physical Therapy

## 2021-11-17 DIAGNOSIS — M533 Sacrococcygeal disorders, not elsewhere classified: Secondary | ICD-10-CM | POA: Insufficient documentation

## 2021-11-17 DIAGNOSIS — R278 Other lack of coordination: Secondary | ICD-10-CM | POA: Diagnosis not present

## 2021-11-17 DIAGNOSIS — R2689 Other abnormalities of gait and mobility: Secondary | ICD-10-CM | POA: Diagnosis not present

## 2021-11-17 NOTE — Patient Instructions (Addendum)
   night   -stretches   Mon Tue Wed Thurs Fri  Sat Sun  open book stretch L / R  - 15 reps            Marlou Sa pose                           Strengthening  Mon Tue Wed Magdalene Molly Fri  Sat Sun             Deep core level 1  ( 10 breaths)   and 2  ( 6 min)            Clam shells              AT work   Mon Tue Wed Thurs Fri  Sat Jonna Munro totter  Band             Heel raises            Pleats minisquat            Side step mini squat with red band for upper arms - wings            Multidifis             __  Today's new exercises:  See your videos)   Minisquat: Scoot buttocks back slight, hinge like you are looking at your reflection on a pond  Knees behind toes,  Inhale to "smell flowers"  Exhale on the rise "like rocket"  Do not lock knees, have more weight across ballmounds of feet, toes relaxed   THEN HALF step on both feet first lap with left foot leading down a hall way = 1 lap  Repeated with other foot leading =1 lap  ADD the red band to strengthen the back   2 laps each side   __  Multifidis twist  Blue Band is on doorknob: sit facing perpendicular to door , sit halfway towards front of chair, firm through 4 points of contact at buttocks and feet. Feet are placed hip with apart.   Twisting trunk without moving the hips and knees Hold band at the level of ribcage, elbows bent,shoulder blades roll back and down like squeezing a pencil under armpit   Exhale twist,.10-15 deg away from door without moving your hips/ knees, press more weight on the side of the sitting bones/ foot opp of your direction of turn as your counterweight. Continue to maintain equal weight through legs.  Keep knee unlocked.  30 reps

## 2021-11-17 NOTE — Therapy (Signed)
OUTPATIENT PHYSICAL THERAPY Treatment   Patient Name: Christie [name] MRN: [id number] DOB:14-Feb-1986, 36 y.o., female Today's Date: 11/17/2021   PT End of Session - 11/17/21 0856     Visit Number 8    Number of Visits 10    Date for PT Re-Evaluation 11/27/21    PT Start Time 0851    PT Stop Time 0930    PT Time Calculation (min) 39 min    Activity Tolerance Patient tolerated treatment well             Past Medical History:  Diagnosis Date   GERD (gastroesophageal reflux disease)    History of gestational hypertension    Pregnancy 01/17/2017   Pregnancy induced hypertension    Past Surgical History:  Procedure Laterality Date   NO PAST SURGERIES     WISDOM TOOTH EXTRACTION     Patient Active Problem List   Diagnosis Date Noted   Gross hematuria 07/27/2021   Dysuria 07/27/2021   Generalized abdominal pain 06/27/2021   Slow transit constipation 03/23/2021   Non-recurrent acute suppurative otitis media of left ear without spontaneous rupture of tympanic membrane 03/23/2021   Concussion 06/10/2020   Biological false positive RPR test 07/04/2016   Hernia, hiatal 10/01/2014   GERD (gastroesophageal reflux disease) 10/01/2014      REFERRING PROVIDER: Minda Ditto   REFERRING DIAG:  M62.89 (ICD-10-CM) - Pelvic floor dysfunction  Rationale for Evaluation and Treatment Rehabilitation  THERAPY DIAG:  Other lack of coordination  Sacrococcygeal disorders, not elsewhere classified  Other abnormalities of gait and mobility  ONSET DATE: 5 years ago after 1st child   SUBJECTIVE:                                                                                                                                                                                           SUBJECTIVE STATEMENT: Pt reports the past couple of weeks, pt had not been able to do her HEP. Her family schedule was busy and she even helped to pack her car for a trip, host guests with cleaning. With these  increased tasks, pt's back pain and R hip pain returned but at a 60% of the past intensity.    PERTINENT HISTORY:  Gynecological Hx: 1st vaginal delivery with 2-3 perineal stitches, 2nd vaginal and no tearing. Pt had elevated HB during pregnancy and had induced labor. Second pregnancy with LBP and "hip flexor" pain. Pt enjoys yoga and wants to get back to yoga.       PRECAUTIONS: None  WEIGHT BEARING RESTRICTIONS No  FALLS:  Has patient fallen in last 6 months? No  LIVING  ENVIRONMENT: Lives with: lives with their family Lives in: House/apartment Stairs: Yes: External: 3 steps; rail  Has following equipment at home: None  OCCUPATION:  Surveyor, quantity ( moderate sedentary job)   PLOF: Independent  PATIENT GOALS                Decrease hip flexor and pelvic pain and return to yoga    OBJECTIVE:      [facility] PT Assessment - 11/17/21 0959       Coordination   Coordination and Movement Description excessive trunk rotation with multidifis HEP      Squat   Comments good form at feet, knee, hips, increased lumbar lordosis             [facility] Adult PT Treatment/Exercise - 11/17/21 1000       Therapeutic Activites    Other Therapeutic Activities co-created strategies for compliance to HEP, created a chart to follow for compliance for flexibility and strengthening      Neuro Re-ed    Neuro Re-ed Details  cued for multidis HEP, CKC hip adbuction/ scapular retraction strengthening with resistance bands                HOME EXERCISE PROGRAM: See pt instruction section    ASSESSMENT:  CLINICAL IMPRESSION: Pt required co-created strategies for compliance to HEP, created a chart to follow for compliance for flexibility and strengthening. Pt required cued for multidis HEP, CKC hip adbuction/ scapular retraction strengthening with resistance bands. Plan to assess pelvic floor at next session.  Plan to administer FOTO at next session.   Pt benefits from skilled PT.     OBJECTIVE IMPAIRMENTS Abnormal gait, decreased activity tolerance, decreased coordination, decreased endurance, decreased mobility, difficulty walking, decreased strength, decreased safety awareness, hypomobility, increased fascial restrictions, increased muscle spasms, impaired flexibility, improper body mechanics, postural dysfunction, and pain.   ACTIVITY LIMITATIONS sitting, sleeping, continence, and toileting  PARTICIPATION LIMITATIONS: interpersonal relationship, community activity, and occupation  PERSONAL FACTORS  Past/current experiences are also affecting patient's functional outcome.   REHAB POTENTIAL: Good  CLINICAL DECISION MAKING: Evolving/moderate complexity  EVALUATION COMPLEXITY: Moderate   PATIENT EDUCATION:   Education details: Showed pt anatomy images. Explained muscles attachments/ connection, physiology of deep core system/ spinal- thoracic-pelvis-lower kinetic chain as they relate to pt's presentation, Sx, and past Hx. Explained what and how these areas of deficits need to be restored to balance and function   See clinical impression  Person educated: Patient Education method: Explanation, Demonstration, Tactile cues, Verbal cues, and Handouts Education comprehension: verbalized understanding, returned demonstration, verbal cues required, tactile cues required, and needs further education   PLAN: PT FREQUENCY: 1x/week  PT DURATION: 10 weeks  PLANNED INTERVENTIONS: Therapeutic exercises, Therapeutic activity, Neuromuscular re-education, Balance training, Gait training, Patient/Family education, Joint mobilization, Dry Needling, Spinal mobilization, Cryotherapy, Moist heat, Taping, and Manual therapy.  PLAN FOR NEXT SESSION: see pt clinical impression     GOALS: Goals reviewed with patient? Yes  SHORT TERM GOALS: Target date: 12/15/2021  Pt wqill be IND with HEP Baseline: not IND  Goal status: INITIAL   LONG TERM GOALS: Target date:  01/26/2022  Pt will demo > 5 pt change on FOTO  to improve QOL and function Urinary Problem baseline  21 pts --> 0 pts  Pelvic Pain baseline 25 pts  --> 8 pts 10/18/21  Bowel baseline  8pts --> 0 pts    Baseline: see above  Goal status: Achieved   2.  Pt will report feeling  complete with urination and not have to return again to toilet across 2/3 trips in order to perform work dutes  Baseline: feel incomplete 30-40 % of the time  Goal status: INITIAL  3.  Pt will report being able to sit and stand for > 30 min without hip flexor pain in order to play with kids and work  Baseline: 20 mins onset  Goal status:partially met   4.  Pt will have no B knee pain with descending stairs  Baseline: pain Goal status:  partially met   5.  Pt will demo levelled pelvic girdle and shoulder height in order to progress to deep core strengthening HEP and restore mobility at spine, pelvis, gait, posture to practice yoga with less risk for injuries   Baseline: standing: R iliac higher., supine: L ASIS / medial malleoli higher  ( Post Tx, 09/18/21: supine: levelled ASIS, standing R iliac crest still higher)  Goal status: Achieved   6.  Pt will demo increased gait speed t o> 1.34 m/s in order to maintain walking routine Baseline: 1.19 m/s  Goal status:Achieved ( 10/18/21: 1.48 m/s)    Jerl Mina, PT 11/17/2021, 8:57 AM [place] MAIN [facility] SERVICES [place], [place], [address] Phone: [phone]   Fax:  [phone]

## 2021-12-07 ENCOUNTER — Ambulatory Visit: Payer: BC Managed Care – PPO | Admitting: Physical Therapy

## 2021-12-07 DIAGNOSIS — M533 Sacrococcygeal disorders, not elsewhere classified: Secondary | ICD-10-CM

## 2021-12-07 DIAGNOSIS — R2689 Other abnormalities of gait and mobility: Secondary | ICD-10-CM

## 2021-12-07 DIAGNOSIS — R278 Other lack of coordination: Secondary | ICD-10-CM

## 2021-12-07 NOTE — Therapy (Signed)
OUTPATIENT PHYSICAL THERAPY Discharge Summary   Patient Name: Christie [name] MRN: [id number] DOB:19-Dec-1985, 36 y.o., female Today's Date: 12/07/2021     Past Medical History:  Diagnosis Date   GERD (gastroesophageal reflux disease)    History of gestational hypertension    Pregnancy 01/17/2017   Pregnancy induced hypertension    Past Surgical History:  Procedure Laterality Date   NO PAST SURGERIES     WISDOM TOOTH EXTRACTION     Patient Active Problem List   Diagnosis Date Noted   Gross hematuria 07/27/2021   Dysuria 07/27/2021   Generalized abdominal pain 06/27/2021   Slow transit constipation 03/23/2021   Non-recurrent acute suppurative otitis media of left ear without spontaneous rupture of tympanic membrane 03/23/2021   Concussion 06/10/2020   Biological false positive RPR test 07/04/2016   Hernia, hiatal 10/01/2014   GERD (gastroesophageal reflux disease) 10/01/2014      REFERRING PROVIDER: Minda Ditto   REFERRING DIAG:  M62.89 (ICD-10-CM) - Pelvic floor dysfunction  Rationale for Evaluation and Treatment Rehabilitation  THERAPY DIAG:  No diagnosis found.  ONSET DATE: 5 years ago after 1st child     ASSESSMENT:  CLINICAL IMPRESSION: Pt achieved 100% of her goals and FOTO score has improved significantly. Hip flexor pain,  urinary issues, and straining  =with bowel movements have improved. Pt's pelvic girdle and spine, SIJ mobility, deep core strength and lower kinetic chain issues have all improved. Pt is ready for d/c at this time.    OBJECTIVE IMPAIRMENTS Abnormal gait, decreased activity tolerance, decreased coordination, decreased endurance, decreased mobility, difficulty walking, decreased strength, decreased safety awareness, hypomobility, increased fascial restrictions, increased muscle spasms, impaired flexibility, improper body mechanics, postural dysfunction, and pain.   ACTIVITY LIMITATIONS sitting, sleeping, continence, and  toileting  PARTICIPATION LIMITATIONS: interpersonal relationship, community activity, and occupation  PERSONAL FACTORS  Past/current experiences are also affecting patient's functional outcome.   REHAB POTENTIAL: Good  CLINICAL DECISION MAKING: Evolving/moderate complexity  EVALUATION COMPLEXITY: Moderate   PATIENT EDUCATION:   Education details: Showed pt anatomy images. Explained muscles attachments/ connection, physiology of deep core system/ spinal- thoracic-pelvis-lower kinetic chain as they relate to pt's presentation, Sx, and past Hx. Explained what and how these areas of deficits need to be restored to balance and function   See clinical impression  Person educated: Patient Education method: Explanation, Demonstration, Tactile cues, Verbal cues, and Handouts Education comprehension: verbalized understanding, returned demonstration, verbal cues required, tactile cues required, and needs further education   PLAN: PT FREQUENCY: 1x/week  PT DURATION: 10 weeks  PLANNED INTERVENTIONS: Therapeutic exercises, Therapeutic activity, Neuromuscular re-education, Balance training, Gait training, Patient/Family education, Joint mobilization, Dry Needling, Spinal mobilization, Cryotherapy, Moist heat, Taping, and Manual therapy.  PLAN FOR NEXT SESSION: see pt clinical impression     GOALS: Goals reviewed with patient? Yes  SHORT TERM GOALS: Target date: 01/04/2022  Pt wqill be IND with HEP Baseline: not IND  Goal status: INITIAL   LONG TERM GOALS: Target date: 02/15/2022  Pt will demo > 5 pt change on FOTO  to improve QOL and function Urinary Problem baseline  21 pts --> 0 pts  Pelvic Pain baseline 25 pts  --> 0 pts 10/18/21  Bowel baseline  8pts --> 0 pts    Baseline: see above  Goal status: Achieved   2.  Pt will report feeling complete with urination and not have to return again to toilet across 2/3 trips in order to perform work dutes  Baseline: feel incomplete  30-40 %  of the time  Goal status: Achieved   3.  Pt will report being able to sit and stand for > 30 min without hip flexor pain in order to play with kids and work  Baseline: 20 mins onset  Goal status:partially met   4.  Pt will have no B knee pain with descending stairs  Baseline: pain Goal status:  Achieved  5.  Pt will demo levelled pelvic girdle and shoulder height in order to progress to deep core strengthening HEP and restore mobility at spine, pelvis, gait, posture to practice yoga with less risk for injuries   Baseline: standing: R iliac higher., supine: L ASIS / medial malleoli higher  ( Post Tx, 09/18/21: supine: levelled ASIS, standing R iliac crest still higher)  Goal status: Achieved   6.  Pt will demo increased gait speed t o> 1.34 m/s in order to maintain walking routine Baseline: 1.19 m/s  Goal status:Achieved ( 10/18/21: 1.48 m/s)    Jerl Mina, PT 12/07/2021, 5:00 PM [place] MAIN [facility] SERVICES [place], [place], [address] Phone: [phone]   Fax:  [phone]

## 2021-12-11 DIAGNOSIS — F411 Generalized anxiety disorder: Secondary | ICD-10-CM | POA: Diagnosis not present

## 2022-01-01 ENCOUNTER — Ambulatory Visit (INDEPENDENT_AMBULATORY_CARE_PROVIDER_SITE_OTHER): Payer: Self-pay | Admitting: Physician Assistant

## 2022-01-01 ENCOUNTER — Encounter: Payer: Self-pay | Admitting: Physician Assistant

## 2022-01-01 VITALS — BP 120/8 | HR 88 | Temp 98.5°F | Ht 64.0 in | Wt 172.4 lb

## 2022-01-01 DIAGNOSIS — U071 COVID-19: Secondary | ICD-10-CM

## 2022-01-01 DIAGNOSIS — J069 Acute upper respiratory infection, unspecified: Secondary | ICD-10-CM

## 2022-01-01 LAB — POC COVID19 BINAXNOW: SARS Coronavirus 2 Ag: POSITIVE — AB

## 2022-01-01 NOTE — Progress Notes (Signed)
[facility] [address], [place] [address]   Office Visit Note  Patient Name: Joniah Nameth Date of Birth [id number]  Medical Record number [id number]  Date of Service: 01/01/2022  Chief Complaint  Patient presents with   Sinusitis    Started Friday. Sinus pressure, congested, ear pain, coughing. No ST or body aches.      36  y/o F presents to the clinic for c/o sinus congestion x 2-3 days. No fever, CP, body aches, fever, or SOB. No known exposure to covid or flu.   Sinusitis Associated symptoms include congestion and sinus pressure. Pertinent negatives include no sore throat.      Current Medication:  Outpatient Encounter Medications as of 01/01/2022  Medication Sig   levonorgestrel (MIRENA, 52 MG,) 20 MCG/DAY IUD Mirena 21 mcg/24 hours (8 yrs) 52 mg intrauterine device  provided by care center   Probiotic Product (CULTURELLE PROBIOTICS PO) Take 1 capsule by mouth daily.   No facility-administered encounter medications on file as of 01/01/2022.      Medical History: Past Medical History:  Diagnosis Date   GERD (gastroesophageal reflux disease)    History of gestational hypertension    Pregnancy 01/17/2017   Pregnancy induced hypertension      Vital Signs: BP (!) 120/8 (BP Location: Left Arm, Patient Position: Sitting, Cuff Size: Normal)   Pulse 88   Temp 98.5 F (36.9 C) (Tympanic)   Ht 5\' 4"  (1.626 m)   Wt 172 lb 6.4 oz (78.2 kg)   SpO2 99%   BMI 29.59 kg/m    Review of Systems  Constitutional: Negative.   HENT:  Positive for congestion, postnasal drip, sinus pressure and sinus pain. Negative for sore throat and trouble swallowing.   Respiratory: Negative.    Cardiovascular: Negative.   Neurological: Negative.     Physical Exam Constitutional:      Appearance: Normal appearance.  HENT:     Head: Atraumatic.     Right Ear: Tympanic membrane, ear canal and external ear normal. There is no impacted cerumen.     Left Ear:  Tympanic membrane, ear canal and external ear normal. There is no impacted cerumen.     Ears:     Comments: R TM appears to have air-fluid level.    Nose: Nose normal.     Mouth/Throat:     Mouth: Mucous membranes are moist.     Pharynx: Oropharynx is clear.  Eyes:     Extraocular Movements: Extraocular movements intact.  Cardiovascular:     Rate and Rhythm: Normal rate and regular rhythm.  Pulmonary:     Effort: Pulmonary effort is normal.     Breath sounds: Normal breath sounds.  Musculoskeletal:     Cervical back: Neck supple.  Skin:    General: Skin is warm.  Neurological:     Mental Status: She is alert.  Psychiatric:        Mood and Affect: Mood normal.        Behavior: Behavior normal.        Thought Content: Thought content normal.        Judgment: Judgment normal.       Assessment/Plan:  1. COVID-19 virus infection  2. URI with cough and congestion - POC COVID-19 BinaxNow  Reviewed positive test result for covid. She verbalized understanding. Stay well hydrated.  Take otc oral decongestant ie as directed on the bottle. May take Ibuprofen/Tylenol for fever or body aches as directed on  the bottle if needed. Reviewed CDC guidelines with patient. Pt will be isolated for 5 days from the start of symptoms and use a mask for an additional 5 days. Must be fever free for 24 hours prior to returning to campus.  RTC prn General Counseling: amily depp understanding of the findings of todays visit and agrees with plan of treatment. I have discussed any further diagnostic evaluation that may be needed or ordered today. We also reviewed her medications today. she has been encouraged to call the office with any questions or concerns that should arise related to todays visit.    Time spent:[address] [place], [place] Physician Assistant

## 2022-01-02 ENCOUNTER — Encounter: Payer: BC Managed Care – PPO | Admitting: Physical Therapy

## 2022-01-10 DIAGNOSIS — F411 Generalized anxiety disorder: Secondary | ICD-10-CM | POA: Diagnosis not present

## 2022-01-18 ENCOUNTER — Encounter: Payer: Self-pay | Admitting: Physician Assistant

## 2022-01-18 ENCOUNTER — Ambulatory Visit (INDEPENDENT_AMBULATORY_CARE_PROVIDER_SITE_OTHER): Payer: Self-pay | Admitting: Physician Assistant

## 2022-01-18 VITALS — BP 118/72 | HR 99 | Temp 98.6°F

## 2022-01-18 DIAGNOSIS — J069 Acute upper respiratory infection, unspecified: Secondary | ICD-10-CM

## 2022-01-18 NOTE — Progress Notes (Signed)
[facility] [address] [place], [place] [address]   Office Visit Note  Patient Name: Christie [name] Date of Birth [id number]  Medical Record number [id number]  Date of Service: 01/18/2022  Chief Complaint  Patient presents with   Sore Throat    Started over the weekend. Woke her up at 2am. Worse at night. Some congestion, coughing some. No fever, BA or chills. Left ear pain      36 y/o F presents to the clinic for c/o sore throat x few days. No known exposure to strep. +covid infection over 2 weeks ago. Currently denies CP, SOB, body aches, or fever. No recent dental procedures. Worse pain at night with post-nasal drainage.   Sore Throat  Associated symptoms include congestion. Pertinent negatives include no ear discharge or trouble swallowing.      Current Medication:  Outpatient Encounter Medications as of 01/18/2022  Medication Sig   levonorgestrel (MIRENA, 52 MG,) 20 MCG/DAY IUD Mirena 21 mcg/24 hours (8 yrs) 52 mg intrauterine device  provided by care center   Probiotic Product (CULTURELLE PROBIOTICS PO) Take 1 capsule by mouth daily.   No facility-administered encounter medications on file as of 01/18/2022.      Medical History: Past Medical History:  Diagnosis Date   GERD (gastroesophageal reflux disease)    History of gestational hypertension    Pregnancy 01/17/2017   Pregnancy induced hypertension      Vital Signs: BP 118/72 (BP Location: Left Arm, Patient Position: Sitting, Cuff Size: Normal)   Pulse 99   Temp 98.6 F (37 C) (Tympanic)   SpO2 100%    Review of Systems  Constitutional: Negative.   HENT:  Positive for congestion, postnasal drip and sore throat. Negative for ear discharge, rhinorrhea, sinus pressure, sinus pain, trouble swallowing and voice change.   Respiratory: Negative.    Cardiovascular: Negative.   Neurological: Negative.     Physical Exam Constitutional:      Appearance: Normal appearance.  HENT:     Head:  Atraumatic.     Right Ear: Tympanic membrane, ear canal and external ear normal.     Left Ear: Tympanic membrane, ear canal and external ear normal.     Nose: Nose normal.     Mouth/Throat:     Mouth: Mucous membranes are moist.     Pharynx: Oropharynx is clear. No pharyngeal swelling, oropharyngeal exudate or posterior oropharyngeal erythema.     Tonsils: No tonsillar exudate.  Eyes:     Extraocular Movements: Extraocular movements intact.  Cardiovascular:     Rate and Rhythm: Normal rate and regular rhythm.  Pulmonary:     Effort: Pulmonary effort is normal.     Breath sounds: Normal breath sounds.  Musculoskeletal:     Cervical back: Neck supple.  Skin:    General: Skin is warm.  Neurological:     Mental Status: She is alert.  Psychiatric:        Mood and Affect: Mood normal.        Behavior: Behavior normal.        Thought Content: Thought content normal.        Judgment: Judgment normal.       Assessment/Plan:  1. Viral upper respiratory tract infection  Increase fluids Humidifier at home May take otc NSAIDs as directed on the bottle. Drink warm water with honey or eat soft ice cream to soothe the throat Continue to watch for worsening symptoms RTC prn Pt verbalized understanding and in  agreement.   General Counseling: khushboo chuck understanding of the findings of todays visit and agrees with plan of treatment. I have discussed any further diagnostic evaluation that may be needed or ordered today. We also reviewed her medications today. she has been encouraged to call the office with any questions or concerns that should arise related to todays visit.    Time spent:20 Minutes    Gilberto Better, [place] Physician Assistant

## 2022-04-20 ENCOUNTER — Ambulatory Visit (INDEPENDENT_AMBULATORY_CARE_PROVIDER_SITE_OTHER): Payer: Self-pay

## 2022-04-20 DIAGNOSIS — Z23 Encounter for immunization: Secondary | ICD-10-CM

## 2022-04-24 ENCOUNTER — Ambulatory Visit: Payer: BC Managed Care – PPO | Admitting: Family Medicine

## 2022-04-24 ENCOUNTER — Encounter: Payer: Self-pay | Admitting: Family Medicine

## 2022-04-24 VITALS — BP 116/71 | HR 70 | Ht 64.0 in | Wt 175.4 lb

## 2022-04-24 DIAGNOSIS — F428 Other obsessive-compulsive disorder: Secondary | ICD-10-CM

## 2022-04-24 DIAGNOSIS — Z6830 Body mass index (BMI) 30.0-30.9, adult: Secondary | ICD-10-CM

## 2022-04-24 DIAGNOSIS — E6609 Other obesity due to excess calories: Secondary | ICD-10-CM

## 2022-04-24 DIAGNOSIS — R1013 Epigastric pain: Secondary | ICD-10-CM | POA: Diagnosis not present

## 2022-04-24 DIAGNOSIS — K2100 Gastro-esophageal reflux disease with esophagitis, without bleeding: Secondary | ICD-10-CM | POA: Diagnosis not present

## 2022-04-24 DIAGNOSIS — F411 Generalized anxiety disorder: Secondary | ICD-10-CM | POA: Diagnosis not present

## 2022-04-24 MED ORDER — SUCRALFATE 1 G PO TABS: 1.0000 g | ORAL_TABLET | Freq: Three times a day (TID) | ORAL | 0 refills | Status: DC

## 2022-04-24 MED ORDER — BUPROPION HCL ER (XL) 150 MG PO TB24: 150.0000 mg | ORAL_TABLET | Freq: Every day | ORAL | 1 refills | Status: DC

## 2022-04-24 MED ORDER — PANTOPRAZOLE SODIUM 40 MG PO TBEC: 40.0000 mg | DELAYED_RELEASE_TABLET | Freq: Two times a day (BID) | ORAL | 1 refills | Status: DC

## 2022-04-24 NOTE — Assessment & Plan Note (Signed)
Acute, <7 days of s/s  Brought on likely by poor diet choices and poor meal intake in a period of acute stress Has stabilized in pain level Recommend carafate and PPI for GERD s/s

## 2022-04-24 NOTE — Progress Notes (Signed)
Established patient visit   Patient: Christie [name]   DOB: [birth year]   37 y.o. Female  MRN: [id number] Visit Date: 04/24/2022  Today's healthcare provider: Gwyneth Sprout, FNP   Introduced to nurse practitioner role and practice setting.  All questions answered.  Discussed provider/patient relationship and expectations.  Subjective    ANXIETY/STRESS Duration: 6 weeks- started with her son being sick at Christmas, afraid her kids would get sick for her BIL's wedding  Anxious mood: yes  Excessive worrying: yes Irritability: yes  Sweating: no Nausea: yes Palpitations:no Hyperventilation: no Panic attacks: no Agoraphobia: no  Obscessions/compulsions: yes Depressed mood: no Anhedonia: yes Weight changes: no Insomnia: no   Hypersomnia: no Fatigue/loss of energy: yes Feelings of worthlessness: no Feelings of guilt: no Impaired concentration/indecisiveness: yes Suicidal ideations: no  Crying spells: no Recent Stressors/Life Changes: yes   Relationship problems: no   Family stress: yes     Financial stress: no    Job stress: no    Recent death/loss: no   ABDOMINAL PAIN  Duration:6 days Onset: sudden Severity: severe Quality: gnawing, burning, pressure-like, shooting, and tender Location:  epigastric  Episode duration:  Radiation: yes Status: fluctuating Treatments attempted: antacids, PPI, and H2 Blocker  Fever: no Nausea: yes Vomiting: no Weight loss: no Decreased appetite: yes- fearful to eat Diarrhea: no Constipation: no Blood in stool: no Heartburn: yes Jaundice: no Rash: no Dysuria/urinary frequency: no Hematuria: no Recurrent NSAID use: no    Medications: Outpatient Medications Prior to Visit  Medication Sig   levonorgestrel (MIRENA, 52 MG,) 20 MCG/DAY IUD Mirena 21 mcg/24 hours (8 yrs) 52 mg intrauterine device  provided by care center   Probiotic Product (CULTURELLE PROBIOTICS PO) Take 1 capsule by mouth daily.   No  facility-administered medications prior to visit.    Review of Systems     Objective    BP 116/71   Pulse 70   Ht 5' 4"$  (1.626 m)   Wt 175 lb 6.4 oz (79.6 kg)   SpO2 100%   BMI 30.11 kg/m    Physical Exam Vitals and nursing note reviewed.  Constitutional:      General: She is not in acute distress.    Appearance: Normal appearance. She is obese. She is not ill-appearing, toxic-appearing or diaphoretic.  HENT:     Head: Normocephalic and atraumatic.  Cardiovascular:     Rate and Rhythm: Normal rate and regular rhythm.     Pulses: Normal pulses.     Heart sounds: Normal heart sounds. No murmur heard.    No friction rub. No gallop.  Pulmonary:     Effort: Pulmonary effort is normal. No respiratory distress.     Breath sounds: Normal breath sounds. No stridor. No wheezing, rhonchi or rales.  Chest:     Chest wall: No tenderness.  Abdominal:     General: Bowel sounds are normal. There is no distension.     Palpations: Abdomen is soft. There is no mass.     Tenderness: There is abdominal tenderness. There is no guarding or rebound.     Hernia: No hernia is present.     Comments: Deep dull pain in RUQ  Musculoskeletal:        General: No swelling, tenderness, deformity or signs of injury. Normal range of motion.     Right lower leg: No edema.     Left lower leg: No edema.  Skin:    General: Skin is warm and dry.  Capillary Refill: Capillary refill takes less than 2 seconds.     Coloration: Skin is not jaundiced or pale.     Findings: No bruising, erythema, lesion or rash.  Neurological:     General: No focal deficit present.     Mental Status: She is alert and oriented to person, place, and time. Mental status is at baseline.     Cranial Nerves: No cranial nerve deficit.     Sensory: No sensory deficit.     Motor: No weakness.     Coordination: Coordination normal.  Psychiatric:        Mood and Affect: Mood is anxious.        Behavior: Behavior normal.         Thought Content: Thought content normal.        Judgment: Judgment normal.     No results found for any visits on 04/24/22.  Assessment & Plan     Problem List Items Addressed This Visit       Digestive   GERD (gastroesophageal reflux disease) - Primary    Acute on chronic, encourage small, bland meals and frequent snacks Recommend PPI and Carafate to assist       Relevant Medications   sucralfate (CARAFATE) 1 g tablet   pantoprazole (PROTONIX) 40 MG tablet     Other   Class 1 obesity due to excess calories without serious comorbidity with body mass index (BMI) of 30.0 to 30.9 in adult    Body mass index is 30.11 kg/m. Continue to recommend balanced, lower carb meals. Smaller meal size, adding snacks. Choosing water as drink of choice and increasing purposeful exercise.       Epigastric pain    Acute, <7 days of s/s  Brought on likely by poor diet choices and poor meal intake in a period of acute stress Has stabilized in pain level Recommend carafate and PPI for GERD s/s      Relevant Medications   sucralfate (CARAFATE) 1 g tablet   GAD (generalized anxiety disorder)    Acute on chronic, x 3 months Trial of wellbutrin; f/u in 6 months Focus on attitude and what can be controlled Re establish with therapy Give yourself 1-2 mins to stress and then regroup and try to move on       Relevant Medications   buPROPion (WELLBUTRIN XL) 150 MG 24 hr tablet   Obsessive thinking    Cyclic thinking; reports loop of what ifs and what thens Trial of wellbutrin to assist Continue therapy Contracted for safety; denies SI or HI      Relevant Medications   buPROPion (WELLBUTRIN XL) 150 MG 24 hr tablet   Return in about 6 weeks (around 06/05/2022) for anxiety and depression.     Vonna Kotyk, FNP, have reviewed all documentation for this visit. The documentation on 04/24/22 for the exam, diagnosis, procedures, and orders are all accurate and complete.  Gwyneth Sprout, [place] [phone] (phone) [phone] (fax)  [place]

## 2022-04-24 NOTE — Assessment & Plan Note (Signed)
Cyclic thinking; reports loop of what ifs and what thens Trial of wellbutrin to assist Continue therapy Contracted for safety; denies SI or HI

## 2022-04-24 NOTE — Assessment & Plan Note (Signed)
Acute on chronic, encourage small, bland meals and frequent snacks Recommend PPI and Carafate to assist

## 2022-04-24 NOTE — Assessment & Plan Note (Signed)
Acute on chronic, x 3 months Trial of wellbutrin; f/u in 6 months Focus on attitude and what can be controlled Re establish with therapy Give yourself 1-2 mins to stress and then regroup and try to move on

## 2022-04-24 NOTE — Assessment & Plan Note (Signed)
Body mass index is 30.11 kg/m. Continue to recommend balanced, lower carb meals. Smaller meal size, adding snacks. Choosing water as drink of choice and increasing purposeful exercise.

## 2022-04-26 ENCOUNTER — Telehealth: Payer: Self-pay | Admitting: Family Medicine

## 2022-05-04 DIAGNOSIS — F411 Generalized anxiety disorder: Secondary | ICD-10-CM | POA: Diagnosis not present

## 2022-05-29 ENCOUNTER — Other Ambulatory Visit: Payer: Self-pay | Admitting: Family Medicine

## 2022-05-29 DIAGNOSIS — K2100 Gastro-esophageal reflux disease with esophagitis, without bleeding: Secondary | ICD-10-CM

## 2022-06-08 ENCOUNTER — Telehealth: Payer: BC Managed Care – PPO | Admitting: Family Medicine

## 2022-06-08 DIAGNOSIS — F411 Generalized anxiety disorder: Secondary | ICD-10-CM | POA: Diagnosis not present

## 2022-06-08 NOTE — Progress Notes (Unsigned)
    MyChart Video Visit    Virtual Visit via Video Note   This format is felt to be most appropriate for this patient at this time. Physical exam was limited by quality of the video and audio technology used for the visit.   Patient location: *** Provider location: ***  I discussed the limitations of evaluation and management by telemedicine and the availability of in person appointments. The patient expressed understanding and agreed to proceed.  Patient: Christie [name]   DOB: 08-11-1985   37 y.o. Female  MRN: [id number] Visit Date: 06/11/2022  Today's healthcare provider: Gwyneth Sprout, FNP   No chief complaint on file.  Subjective    HPI  ***   Medications: Outpatient Medications Prior to Visit  Medication Sig   buPROPion (WELLBUTRIN XL) 150 MG 24 hr tablet Take 1 tablet (150 mg total) by mouth daily.   levonorgestrel (MIRENA, 52 MG,) 20 MCG/DAY IUD Mirena 21 mcg/24 hours (8 yrs) 52 mg intrauterine device  provided by care center   pantoprazole (PROTONIX) 40 MG tablet TAKE ONE TABLET BY MOUTH TWICE DAILY BEFRE A MEAL   Probiotic Product (CULTURELLE PROBIOTICS PO) Take 1 capsule by mouth daily.   sucralfate (CARAFATE) 1 g tablet Take 1 tablet (1 g total) by mouth 4 (four) times daily -  with meals and at bedtime.   No facility-administered medications prior to visit.    Review of Systems  {Labs  Heme  Chem  Endocrine  Serology  Results Review (optional):23779}   Objective    There were no vitals taken for this visit.  {Show previous vital signs (optional):23777}   Physical Exam     Assessment & Plan     ***  No follow-ups on file.     I discussed the assessment and treatment plan with the patient. The patient was provided an opportunity to ask questions and all were answered. The patient agreed with the plan and demonstrated an understanding of the instructions.   The patient was advised to call back or seek an in-person evaluation if the  symptoms worsen or if the condition fails to improve as anticipated.  I provided *** minutes of non-face-to-face time during this encounter.  {provider attestation***:1}  Gwyneth Sprout, [place] [phone] (phone) [phone] (fax)  [place]

## 2022-06-11 ENCOUNTER — Telehealth (INDEPENDENT_AMBULATORY_CARE_PROVIDER_SITE_OTHER): Payer: BC Managed Care – PPO | Admitting: Family Medicine

## 2022-06-11 DIAGNOSIS — R55 Syncope and collapse: Secondary | ICD-10-CM | POA: Diagnosis not present

## 2022-06-11 DIAGNOSIS — K2100 Gastro-esophageal reflux disease with esophagitis, without bleeding: Secondary | ICD-10-CM | POA: Diagnosis not present

## 2022-06-11 DIAGNOSIS — K5901 Slow transit constipation: Secondary | ICD-10-CM | POA: Diagnosis not present

## 2022-06-11 DIAGNOSIS — R1011 Right upper quadrant pain: Secondary | ICD-10-CM

## 2022-06-11 NOTE — Assessment & Plan Note (Signed)
Chronic, improved Continue protonix 40 mg BID as well as carafate; patient prefers BID vs QID use Refer back to Dr Vicente Males to assist

## 2022-06-11 NOTE — Assessment & Plan Note (Signed)
Chronic, improved Continue protonix 40 mg BID Refer back to Dr Vicente Males to assist

## 2022-06-11 NOTE — Assessment & Plan Note (Signed)
Chronic "gallbladder" pain despite PPI and carafate Will refer back to GI as well as refer for HIDA scan to assist Continue to recommend monitoring of GAD s/s with use of wellbutrin 150 xr daily. Recommend follow up in 1 month-6 weeks given recent start 2 weeks ago.

## 2022-06-11 NOTE — Assessment & Plan Note (Signed)
Reports 2 vasovagal s/s within the past year; one 1 month ago and 2 years ago Patient notes GI losses with acute emesis and falling off toilet in setting of recent 'stomach bug' Previously seen by neurology following previous episode Denies concern for post-concussion syndrome Able to remember events leading up to syncope as well as seconds following come-to BP and HR previously stable at OV

## 2022-06-14 ENCOUNTER — Encounter: Payer: Self-pay | Admitting: Family Medicine

## 2022-06-15 ENCOUNTER — Other Ambulatory Visit: Payer: Self-pay | Admitting: Family Medicine

## 2022-06-15 DIAGNOSIS — F428 Other obsessive-compulsive disorder: Secondary | ICD-10-CM

## 2022-06-15 DIAGNOSIS — F411 Generalized anxiety disorder: Secondary | ICD-10-CM

## 2022-06-15 DIAGNOSIS — R1013 Epigastric pain: Secondary | ICD-10-CM

## 2022-06-15 NOTE — Telephone Encounter (Signed)
Requested Prescriptions  Pending Prescriptions Disp Refills   buPROPion (WELLBUTRIN XL) 150 MG 24 hr tablet [Pharmacy Med Name: BUPROPION HCL ER (XL) 150 MG TAB] 90 tablet 0    Sig: TAKE ONE TABLET BY MOUTH EVERY DAY     Psychiatry: Antidepressants - bupropion Passed - 06/15/2022  2:18 PM      Passed - Cr in normal range and within 360 days    Creatinine, Ser  Date Value Ref Range Status  06/27/2021 0.87 0.57 - 1.00 mg/dL Final   Creatinine, Urine  Date Value Ref Range Status  12/03/2019 39.30 mg/dL Final         Passed - AST in normal range and within 360 days    AST  Date Value Ref Range Status  06/27/2021 22 0 - 40 IU/L Final         Passed - ALT in normal range and within 360 days    ALT  Date Value Ref Range Status  06/27/2021 20 0 - 32 IU/L Final         Passed - Last BP in normal range    BP Readings from Last 1 Encounters:  04/24/22 116/71         Passed - Valid encounter within last 6 months    Recent Outpatient Visits           4 days ago RUQ pain   [facility] Merita Norton T, FNP   1 month ago Gastroesophageal reflux disease with esophagitis without hemorrhage   [facility] [name], FNP   10 months ago Gross hematuria   [facility] Merita Norton T, FNP   11 months ago Generalized abdominal pain   [facility] [name], PA-C   1 year ago Encounter for physical examination   [facility], Lillia Abed, PA-C               sucralfate (CARAFATE) 1 g tablet [Pharmacy Med Name: SUCRALFATE 1 GM TAB] 270 tablet 0    Sig: TAKE ONE TABLET BY MOUTH 3 TIMES DAILY WITH MEALS AND AT BEDTIME     Gastroenterology: Antiacids Passed - 06/15/2022  2:18 PM      Passed - Valid encounter within last 12 months    Recent Outpatient Visits           4 days ago RUQ pain   [facility] Merita Norton T,  FNP   1 month ago Gastroesophageal reflux disease with esophagitis without hemorrhage   [facility] [name], FNP   10 months ago Gross hematuria   [facility] Merita Norton T, [place]   11 months ago Generalized abdominal pain    [facility] [name], PA-C   1 year ago Encounter for physical examination   [facility] [name], [place]

## 2022-06-19 ENCOUNTER — Ambulatory Visit: Payer: BC Managed Care – PPO

## 2022-06-22 NOTE — Telephone Encounter (Signed)
Copied from CRM [id number]. Topic: General - Other >> Jun 22, 2022 12:18 PM Franchot Heidelberg wrote: Reason for CRM: Pt called back for update on peer to peer conversation between PCP and the office where she is scheduled for her HIDA scan. Please advise, and call back today

## 2022-07-02 DIAGNOSIS — F411 Generalized anxiety disorder: Secondary | ICD-10-CM | POA: Diagnosis not present

## 2022-07-16 DIAGNOSIS — D225 Melanocytic nevi of trunk: Secondary | ICD-10-CM | POA: Diagnosis not present

## 2022-07-16 DIAGNOSIS — D2261 Melanocytic nevi of right upper limb, including shoulder: Secondary | ICD-10-CM | POA: Diagnosis not present

## 2022-07-16 DIAGNOSIS — D2262 Melanocytic nevi of left upper limb, including shoulder: Secondary | ICD-10-CM | POA: Diagnosis not present

## 2022-07-16 DIAGNOSIS — D2272 Melanocytic nevi of left lower limb, including hip: Secondary | ICD-10-CM | POA: Diagnosis not present

## 2022-07-17 ENCOUNTER — Other Ambulatory Visit: Payer: Self-pay | Admitting: Family Medicine

## 2022-07-17 DIAGNOSIS — R1013 Epigastric pain: Secondary | ICD-10-CM

## 2022-07-17 NOTE — Telephone Encounter (Signed)
Requested Prescriptions  Pending Prescriptions Disp Refills   sucralfate (CARAFATE) 1 g tablet [Pharmacy Med Name: SUCRALFATE 1 GM TAB] 270 tablet 0    Sig: TAKE ONE TABLET BY MOUTH 3 TIMES DAILY WITH MEALS AND AT BEDTIME     Gastroenterology: Antiacids Passed - 07/17/2022  5:28 PM      Passed - Valid encounter within last 12 months    Recent Outpatient Visits           1 month ago RUQ pain   [facility] Merita Norton T, FNP   2 months ago Gastroesophageal reflux disease with esophagitis without hemorrhage   [facility] [name], FNP   11 months ago Gross hematuria   [facility] Merita Norton T, FNP   1 year ago Generalized abdominal pain   [facility] [name], PA-C   1 year ago Encounter for physical examination   [facility] [name], [place]

## 2022-07-19 ENCOUNTER — Ambulatory Visit (INDEPENDENT_AMBULATORY_CARE_PROVIDER_SITE_OTHER): Payer: Self-pay | Admitting: Adult Health

## 2022-07-19 VITALS — BP 118/80 | HR 118 | Temp 97.8°F

## 2022-07-19 DIAGNOSIS — R5383 Other fatigue: Secondary | ICD-10-CM

## 2022-07-19 DIAGNOSIS — R4189 Other symptoms and signs involving cognitive functions and awareness: Secondary | ICD-10-CM

## 2022-07-19 NOTE — Progress Notes (Signed)
[facility] [address] [place], [place] [address]   Office Visit Note  Patient Name: Markeia Kunz Date of Birth [id number]  Medical Record number [id number]  Date of Service: 07/19/2022  Chief Complaint  Patient presents with   Fatigue     HPI Pt is here for a sick visit. She reports last night after work she started feeling weak. She says "I feel like my blood pressure or something is low"  She also describes some brain fog, and tingling in her hands and feet.  History of anxiety, started Wellbutrin 2 months ago, has been feeling great.    Current Medication:  Outpatient Encounter Medications as of 07/19/2022  Medication Sig   buPROPion (WELLBUTRIN XL) 150 MG 24 hr tablet TAKE ONE TABLET BY MOUTH EVERY DAY   levonorgestrel (MIRENA, 52 MG,) 20 MCG/DAY IUD Mirena 21 mcg/24 hours (8 yrs) 52 mg intrauterine device  provided by care center   pantoprazole (PROTONIX) 40 MG tablet TAKE ONE TABLET BY MOUTH TWICE DAILY BEFRE A MEAL   Probiotic Product (CULTURELLE PROBIOTICS PO) Take 1 capsule by mouth daily.   sucralfate (CARAFATE) 1 g tablet TAKE ONE TABLET BY MOUTH 3 TIMES DAILY WITH MEALS AND AT BEDTIME   No facility-administered encounter medications on file as of 07/19/2022.      Medical History: Past Medical History:  Diagnosis Date   GERD (gastroesophageal reflux disease)    History of gestational hypertension    Pregnancy 01/17/2017   Pregnancy induced hypertension      Vital Signs: BP 118/80   Pulse (!) 118   Temp 97.8 F (36.6 C) (Tympanic)   SpO2 98%    Review of Systems  Constitutional:  Positive for fatigue. Negative for chills and fever.  HENT:  Negative for sinus pressure, sinus pain, sneezing and sore throat.   Eyes:  Negative for pain and itching.  Respiratory:  Negative for cough.   Cardiovascular:  Negative for chest pain.  Gastrointestinal:  Negative for diarrhea, nausea and vomiting.  Neurological:  Positive for weakness and numbness.  Negative for dizziness and headaches.    Physical Exam Vitals and nursing note reviewed.  Constitutional:      Appearance: Normal appearance.  HENT:     Head: Normocephalic.     Right Ear: Tympanic membrane and ear canal normal.     Left Ear: Tympanic membrane and ear canal normal.     Nose: Nose normal.     Mouth/Throat:     Mouth: Mucous membranes are moist.  Eyes:     Pupils: Pupils are equal, round, and reactive to light.  Neurological:     Mental Status: She is alert.     Assessment/Plan: 1. Other fatigue Will communicate results of labs through mychart.  Discussed signs and symptoms to go directly to ED for.  PT verbalized understanding.  - CBC with Differential/Platelet - VITAMIN D 25 Hydroxy (Vit-D Deficiency, Fractures) - B12 and Folate Panel - TSH - Iron and TIBC - Ferritin - Comprehensive metabolic panel  2. Brain fog - CBC with Differential/Platelet - VITAMIN D 25 Hydroxy (Vit-D Deficiency, Fractures) - B12 and Folate Panel - TSH - Iron and TIBC - Ferritin - Comprehensive metabolic panel     General Counseling: tajanae sare understanding of the findings of todays visit and agrees with plan of treatment. I have discussed any further diagnostic evaluation that may be needed or ordered today. We also reviewed her medications today. she has been encouraged to  call the office with any questions or concerns that should arise related to todays visit.   Orders Placed This Encounter  Procedures   CBC with Differential/Platelet   VITAMIN D 25 Hydroxy (Vit-D Deficiency, Fractures)   B12 and Folate Panel   TSH   Iron and TIBC   Ferritin   Comprehensive metabolic panel    No orders of the defined types were placed in this encounter.   Time spent:20 Minutes    Johnna Acosta AGNP-C Nurse Practitioner

## 2022-07-21 LAB — CBC WITH DIFFERENTIAL/PLATELET
Basophils Absolute: 0 10*3/uL (ref 0.0–0.2)
Basos: 1 %
EOS (ABSOLUTE): 0 10*3/uL (ref 0.0–0.4)
Eos: 0 %
Hematocrit: 43.7 % (ref 34.0–46.6)
Hemoglobin: 13.9 g/dL (ref 11.1–15.9)
Immature Grans (Abs): 0 10*3/uL (ref 0.0–0.1)
Immature Granulocytes: 0 %
Lymphocytes Absolute: 1.7 10*3/uL (ref 0.7–3.1)
Lymphs: 32 %
MCH: 28.3 pg (ref 26.6–33.0)
MCHC: 31.8 g/dL (ref 31.5–35.7)
MCV: 89 fL (ref 79–97)
Monocytes Absolute: 0.3 10*3/uL (ref 0.1–0.9)
Monocytes: 6 %
Neutrophils Absolute: 3.4 10*3/uL (ref 1.4–7.0)
Neutrophils: 61 %
Platelets: 248 10*3/uL (ref 150–450)
RBC: 4.91 x10E6/uL (ref 3.77–5.28)
RDW: 12.4 % (ref 11.7–15.4)
WBC: 5.5 10*3/uL (ref 3.4–10.8)

## 2022-07-21 LAB — COMPREHENSIVE METABOLIC PANEL
ALT: 9 IU/L (ref 0–32)
AST: 12 IU/L (ref 0–40)
Albumin/Globulin Ratio: 1.6 (ref 1.2–2.2)
Albumin: 4.8 g/dL (ref 3.9–4.9)
Alkaline Phosphatase: 83 IU/L (ref 44–121)
BUN/Creatinine Ratio: 11 (ref 9–23)
BUN: 11 mg/dL (ref 6–20)
Bilirubin Total: 0.4 mg/dL (ref 0.0–1.2)
CO2: 20 mmol/L (ref 20–29)
Calcium: 9.5 mg/dL (ref 8.7–10.2)
Chloride: 105 mmol/L (ref 96–106)
Creatinine, Ser: 0.99 mg/dL (ref 0.57–1.00)
Globulin, Total: 3 g/dL (ref 1.5–4.5)
Glucose: 100 mg/dL — ABNORMAL HIGH (ref 70–99)
Potassium: 4 mmol/L (ref 3.5–5.2)
Sodium: 141 mmol/L (ref 134–144)
Total Protein: 7.8 g/dL (ref 6.0–8.5)
eGFR: 76 mL/min/{1.73_m2} (ref >59)

## 2022-07-21 LAB — IRON AND TIBC
Iron Saturation: 22 % (ref 15–55)
Iron: 78 ug/dL (ref 27–159)
Total Iron Binding Capacity: 353 ug/dL (ref 250–450)
UIBC: 275 ug/dL (ref 131–425)

## 2022-07-21 LAB — B12 AND FOLATE PANEL
Folate: 9.2 ng/mL (ref >3.0)
Vitamin B-12: 309 pg/mL (ref 232–1245)

## 2022-07-21 LAB — TSH: TSH: 2.02 u[IU]/mL (ref 0.450–4.500)

## 2022-07-21 LAB — VITAMIN D 25 HYDROXY (VIT D DEFICIENCY, FRACTURES): Vit D, 25-Hydroxy: 29.6 ng/mL — ABNORMAL LOW (ref 30.0–100.0)

## 2022-07-21 LAB — FERRITIN: Ferritin: 87 ng/mL (ref 15–150)

## 2022-07-23 ENCOUNTER — Encounter: Payer: Self-pay | Admitting: Adult Health

## 2022-07-30 DIAGNOSIS — F411 Generalized anxiety disorder: Secondary | ICD-10-CM | POA: Diagnosis not present

## 2022-08-08 DIAGNOSIS — R1011 Right upper quadrant pain: Secondary | ICD-10-CM | POA: Diagnosis not present

## 2022-08-08 DIAGNOSIS — R1013 Epigastric pain: Secondary | ICD-10-CM | POA: Diagnosis not present

## 2022-08-09 ENCOUNTER — Other Ambulatory Visit: Payer: Self-pay | Admitting: Gastroenterology

## 2022-08-09 DIAGNOSIS — R1013 Epigastric pain: Secondary | ICD-10-CM

## 2022-08-09 DIAGNOSIS — R1011 Right upper quadrant pain: Secondary | ICD-10-CM

## 2022-08-15 ENCOUNTER — Ambulatory Visit
Admission: RE | Admit: 2022-08-15 | Discharge: 2022-08-15 | Disposition: A | Discharge status: Home / Self Care | Payer: BC Managed Care – PPO | Source: Ambulatory Visit | Attending: Gastroenterology | Admitting: Gastroenterology

## 2022-08-15 DIAGNOSIS — R1011 Right upper quadrant pain: Secondary | ICD-10-CM | POA: Insufficient documentation

## 2022-08-15 DIAGNOSIS — R1013 Epigastric pain: Secondary | ICD-10-CM

## 2022-08-16 ENCOUNTER — Telehealth: Payer: Self-pay

## 2022-08-16 NOTE — Telephone Encounter (Signed)
Copied from CRM [id number]. Topic: General - Other >> Aug 16, 2022  9:57 AM Clide Dales wrote: Lupita Leash with [facility] Nuclear Medicine Dept called to see if she could cancel the order for patient's NM Hepato W/Eject Fract (Order [id number]). Lupita Leash said that patient has switched providers and does not want to have this test done anymore. Please follow up with Lupita Leash. [phone]

## 2022-08-17 NOTE — Telephone Encounter (Signed)
Done

## 2022-08-27 DIAGNOSIS — F411 Generalized anxiety disorder: Secondary | ICD-10-CM | POA: Diagnosis not present

## 2022-10-17 ENCOUNTER — Other Ambulatory Visit: Payer: Self-pay | Admitting: Family Medicine

## 2022-10-17 DIAGNOSIS — F411 Generalized anxiety disorder: Secondary | ICD-10-CM

## 2022-10-17 DIAGNOSIS — F428 Other obsessive-compulsive disorder: Secondary | ICD-10-CM

## 2022-10-30 ENCOUNTER — Other Ambulatory Visit: Payer: Self-pay

## 2022-10-30 ENCOUNTER — Encounter: Payer: Self-pay | Admitting: Physician Assistant

## 2022-10-30 ENCOUNTER — Ambulatory Visit (INDEPENDENT_AMBULATORY_CARE_PROVIDER_SITE_OTHER): Payer: Self-pay | Admitting: Physician Assistant

## 2022-10-30 VITALS — BP 110/84 | HR 84 | Temp 97.0°F

## 2022-10-30 DIAGNOSIS — J029 Acute pharyngitis, unspecified: Secondary | ICD-10-CM

## 2022-10-30 DIAGNOSIS — Z20818 Contact with and (suspected) exposure to other bacterial communicable diseases: Secondary | ICD-10-CM

## 2022-10-30 LAB — POCT RAPID STREP A (OFFICE): Rapid Strep A Screen: NEGATIVE

## 2022-10-30 NOTE — Progress Notes (Signed)
[facility] [address] [place], [place] [address]   Office Visit Note  Patient Name: Christie [name] Date of Birth [id number]  Medical Record number [id number]  Date of Service: 10/30/2022  Chief Complaint  Patient presents with   Acute Visit    Patient c/o sore throat with redness. Denies difficulty swallowing/fever. She also reports fullness in her ears. Symptoms began about one week ago. She notes that her mom testing positive for strept last Thursday. She also had exposure to COVID through a colleague last week.     37 y/o F presents to the clinic for c/o sore throat and slightly painful to swallow x 6 days without improvement. +known exposure to strep. Denies fever, cough, CP, SOB. Taking Tylenol for pain and helping.      Current Medication:  Outpatient Encounter Medications as of 10/30/2022  Medication Sig   buPROPion (WELLBUTRIN XL) 150 MG 24 hr tablet TAKE 1 TABLET BY MOUTH DAILY   levonorgestrel (MIRENA, 52 MG,) 20 MCG/DAY IUD Mirena 21 mcg/24 hours (8 yrs) 52 mg intrauterine device  provided by care center   omeprazole (PRILOSEC) 40 MG capsule Take 40 mg by mouth daily.   Probiotic Product (CULTURELLE PROBIOTICS PO) Take 1 capsule by mouth daily.   sucralfate (CARAFATE) 1 g tablet TAKE ONE TABLET BY MOUTH 3 TIMES DAILY WITH MEALS AND AT BEDTIME (Patient taking differently: Take 1 g by mouth daily.)   [DISCONTINUED] pantoprazole (PROTONIX) 40 MG tablet TAKE ONE TABLET BY MOUTH TWICE DAILY BEFRE A MEAL (Patient taking differently: Take 40 mg by mouth daily.)   No facility-administered encounter medications on file as of 10/30/2022.      Medical History: Past Medical History:  Diagnosis Date   GERD (gastroesophageal reflux disease)    History of gestational hypertension    Pregnancy 01/17/2017   Pregnancy induced hypertension      Vital Signs: BP 110/84 (BP Location: Left Arm)   Pulse 84   Temp (!) 97 F (36.1 C)   SpO2 98%    Review of Systems   Constitutional: Negative.   HENT:  Positive for sore throat and trouble swallowing. Negative for congestion.   Respiratory: Negative.    Cardiovascular: Negative.   Neurological: Negative.     Physical Exam Constitutional:      Appearance: Normal appearance.  HENT:     Head: Atraumatic.     Right Ear: Tympanic membrane, ear canal and external ear normal.     Left Ear: Tympanic membrane, ear canal and external ear normal.     Nose: Nose normal.     Mouth/Throat:     Mouth: Mucous membranes are moist.     Pharynx: Oropharynx is clear. Posterior oropharyngeal erythema present.     Tonsils: No tonsillar exudate or tonsillar abscesses.     Comments: Mild erythema over the posterior pharynx present.  Eyes:     Extraocular Movements: Extraocular movements intact.  Cardiovascular:     Rate and Rhythm: Normal rate and regular rhythm.  Pulmonary:     Effort: Pulmonary effort is normal.     Breath sounds: Normal breath sounds.  Musculoskeletal:     Cervical back: Neck supple.  Skin:    General: Skin is warm.  Neurological:     Mental Status: She is alert.  Psychiatric:        Mood and Affect: Mood normal.        Behavior: Behavior normal.        Thought  Content: Thought content normal.        Judgment: Judgment normal.       Assessment/Plan:  1. Sore throat - POCT rapid strep A  2. Exposure to strep throat - POCT rapid strep A  Reviewed negative strep test result with patient Increase fluids Use throat logenzes to soothe throat Drink warm liquids or soft ice cream to soothe the throat. Continue to watch for worsening symptoms. Pt verbalized understanding and in agreement.   General Counseling: callysta stricklin understanding of the findings of todays visit and agrees with plan of treatment. I have discussed any further diagnostic evaluation that may be needed or ordered today. We also reviewed her medications today. she has been encouraged to call the office with any  questions or concerns that should arise related to todays visit.    Time spent:20 Minutes    Gilberto Better, [place] Physician Assistant

## 2022-10-31 ENCOUNTER — Ambulatory Visit: Payer: Self-pay | Admitting: Adult Health

## 2022-11-09 DIAGNOSIS — F411 Generalized anxiety disorder: Secondary | ICD-10-CM | POA: Diagnosis not present

## 2022-11-20 ENCOUNTER — Other Ambulatory Visit: Payer: Self-pay | Admitting: Family Medicine

## 2022-11-20 DIAGNOSIS — F428 Other obsessive-compulsive disorder: Secondary | ICD-10-CM

## 2022-11-20 DIAGNOSIS — F411 Generalized anxiety disorder: Secondary | ICD-10-CM

## 2022-11-30 ENCOUNTER — Ambulatory Visit: Payer: BC Managed Care – PPO | Admitting: Family Medicine

## 2022-11-30 VITALS — BP 128/77 | HR 87 | Ht 64.0 in | Wt 154.0 lb

## 2022-11-30 DIAGNOSIS — K449 Diaphragmatic hernia without obstruction or gangrene: Secondary | ICD-10-CM

## 2022-11-30 DIAGNOSIS — F428 Other obsessive-compulsive disorder: Secondary | ICD-10-CM | POA: Diagnosis not present

## 2022-11-30 DIAGNOSIS — G8929 Other chronic pain: Secondary | ICD-10-CM | POA: Insufficient documentation

## 2022-11-30 DIAGNOSIS — R7303 Prediabetes: Secondary | ICD-10-CM | POA: Insufficient documentation

## 2022-11-30 DIAGNOSIS — R109 Unspecified abdominal pain: Secondary | ICD-10-CM

## 2022-11-30 DIAGNOSIS — R55 Syncope and collapse: Secondary | ICD-10-CM

## 2022-11-30 LAB — POCT GLYCOSYLATED HEMOGLOBIN (HGB A1C): HbA1c POC (<> result, manual entry): 5.6 % (ref 4.0–5.6)

## 2022-11-30 MED ORDER — MIDODRINE HCL 2.5 MG PO TABS
2.5000 mg | ORAL_TABLET | Freq: Three times a day (TID) | ORAL | 0 refills | Status: AC
Start: 2022-11-30 — End: ?

## 2022-11-30 MED ORDER — PROMETHAZINE HCL 50 MG PO TABS
50.0000 mg | ORAL_TABLET | Freq: Four times a day (QID) | ORAL | 0 refills | Status: AC | PRN
Start: 2022-11-30 — End: ?

## 2022-11-30 MED ORDER — METOCLOPRAMIDE HCL 5 MG PO TABS
5.0000 mg | ORAL_TABLET | Freq: Three times a day (TID) | ORAL | 0 refills | Status: DC
Start: 2022-11-30 — End: 2023-04-11

## 2022-11-30 NOTE — Assessment & Plan Note (Signed)
Chronic, improved Defer changes at this time Continue wellbutrin 150

## 2022-11-30 NOTE — Assessment & Plan Note (Signed)
Chronic, repeat A1c Continue to recommend balanced, lower carb meals. Smaller meal size, adding snacks. Choosing water as drink of choice and increasing purposeful exercise. A1c remains at 5.6%

## 2022-11-30 NOTE — Progress Notes (Signed)
Established patient visit   Patient: Christie [name]   DOB: [birth year]   37 y.o. Female  MRN: [id number] Visit Date: 11/30/2022  Today's healthcare provider: [name], FNP  Introduced to nurse practitioner role and practice setting.  All questions answered.  Discussed provider/patient relationship and expectations.  Subjective    HPI HPI     Follow-up    Additional comments: Medication refills needs would like fu from concerns in Feb Saw gastro and had ultrasound and found nothing       Last edited by Clois Comber on 11/30/2022  3:51 PM.      Medications: Outpatient Medications Prior to Visit  Medication Sig   buPROPion (WELLBUTRIN XL) 150 MG 24 hr tablet TAKE 1 TABLET BY MOUTH DAILY   levonorgestrel (MIRENA, 52 MG,) 20 MCG/DAY IUD Mirena 21 mcg/24 hours (8 yrs) 52 mg intrauterine device  provided by care center   omeprazole (PRILOSEC) 40 MG capsule Take 40 mg by mouth daily.   [DISCONTINUED] Probiotic Product (CULTURELLE PROBIOTICS PO) Take 1 capsule by mouth daily.   [DISCONTINUED] sucralfate (CARAFATE) 1 g tablet TAKE ONE TABLET BY MOUTH 3 TIMES DAILY WITH MEALS AND AT BEDTIME (Patient not taking: Reported on 11/30/2022)   No facility-administered medications prior to visit.     Objective    BP 128/77 (BP Location: Left Arm, Patient Position: Sitting, Cuff Size: Normal)   Pulse 87   Ht 5\' 4"  (1.626 m)   Wt 154 lb (69.9 kg)   SpO2 100%   BMI 26.43 kg/m   Physical Exam Vitals and nursing note reviewed.  Constitutional:      General: She is not in acute distress.    Appearance: Normal appearance. She is overweight. She is not ill-appearing, toxic-appearing or diaphoretic.  HENT:     Head: Normocephalic and atraumatic.  Cardiovascular:     Rate and Rhythm: Normal rate and regular rhythm.     Pulses: Normal pulses.     Heart sounds: Normal heart sounds. No murmur heard.    No friction rub. No gallop.  Pulmonary:     Effort: Pulmonary effort is  normal. No respiratory distress.     Breath sounds: Normal breath sounds. No stridor. No wheezing, rhonchi or rales.  Chest:     Chest wall: No tenderness.  Abdominal:     Tenderness: There is abdominal tenderness.  Musculoskeletal:        General: No swelling, tenderness, deformity or signs of injury. Normal range of motion.     Right lower leg: No edema.     Left lower leg: No edema.  Skin:    General: Skin is warm and dry.     Capillary Refill: Capillary refill takes less than 2 seconds.     Coloration: Skin is not jaundiced or pale.     Findings: No bruising, erythema, lesion or rash.  Neurological:     General: No focal deficit present.     Mental Status: She is alert and oriented to person, place, and time. Mental status is at baseline.     Cranial Nerves: No cranial nerve deficit.     Sensory: No sensory deficit.     Motor: No weakness.     Coordination: Coordination normal.  Psychiatric:        Mood and Affect: Mood is anxious.        Behavior: Behavior normal.        Thought Content: Thought content is paranoid.  Judgment: Judgment normal.     No results found for any visits on 11/30/22.  Assessment & Plan     Problem List Items Addressed This Visit       Respiratory   Hernia, hiatal    Acute on chronic, on prilosec 20-40 mg daily Denies symptoms of GERD; however, endorses chronic upper abdominal pain         Other   Borderline diabetes    Chronic, repeat A1c Continue to recommend balanced, lower carb meals. Smaller meal size, adding snacks. Choosing water as drink of choice and increasing purposeful exercise. A1c remains at 5.6%      Relevant Orders   POCT HgB A1C   Chronic abdominal pain - Primary    Chronic, unknown cause Known hiatal hernia, on PPI Previous imaging and labs nomal Failed carafate Trial of reglan to assist possible gastroparesis Recommend emergent supply of anti-emetic and midodrine to assist as when she gets emesis she will  vasovagal and often injury herself       Relevant Medications   metoCLOPramide (REGLAN) 5 MG tablet   midodrine (PROAMATINE) 2.5 MG tablet   promethazine (PHENERGAN) 50 MG tablet   Obsessive thinking    Chronic, improved Defer changes at this time Continue wellbutrin 150       Vasovagal syncopes    Chronic, stable Come with acute illness Recommend emergent supply of midodrine and phenergan to have on hand to prophylactic use to increase blood pressure and control nausea       No follow-ups on file.     Leilani Merl, FNP, have reviewed all documentation for this visit. The documentation on 11/30/22 for the exam, diagnosis, procedures, and orders are all accurate and complete.  [name], FNP  [facility] [phone] (phone) [phone] (fax)  [facility]

## 2022-11-30 NOTE — Assessment & Plan Note (Signed)
Chronic, stable Come with acute illness Recommend emergent supply of midodrine and phenergan to have on hand to prophylactic use to increase blood pressure and control nausea

## 2022-11-30 NOTE — Assessment & Plan Note (Signed)
Acute on chronic, on prilosec 20-40 mg daily Denies symptoms of GERD; however, endorses chronic upper abdominal pain

## 2022-11-30 NOTE — Assessment & Plan Note (Signed)
Chronic, unknown cause Known hiatal hernia, on PPI Previous imaging and labs nomal Failed carafate Trial of reglan to assist possible gastroparesis Recommend emergent supply of anti-emetic and midodrine to assist as when she gets emesis she will vasovagal and often injury herself

## 2022-12-04 DIAGNOSIS — Z23 Encounter for immunization: Secondary | ICD-10-CM | POA: Diagnosis not present

## 2022-12-10 DIAGNOSIS — F411 Generalized anxiety disorder: Secondary | ICD-10-CM | POA: Diagnosis not present

## 2023-01-09 DIAGNOSIS — F411 Generalized anxiety disorder: Secondary | ICD-10-CM | POA: Diagnosis not present

## 2023-01-28 ENCOUNTER — Encounter: Payer: Self-pay | Admitting: Physician Assistant

## 2023-01-28 ENCOUNTER — Ambulatory Visit (INDEPENDENT_AMBULATORY_CARE_PROVIDER_SITE_OTHER): Payer: Self-pay | Admitting: Physician Assistant

## 2023-01-28 ENCOUNTER — Other Ambulatory Visit: Payer: Self-pay

## 2023-01-28 VITALS — BP 110/80 | HR 86 | Temp 98.2°F | Ht 64.0 in

## 2023-01-28 DIAGNOSIS — J069 Acute upper respiratory infection, unspecified: Secondary | ICD-10-CM

## 2023-01-28 DIAGNOSIS — R490 Dysphonia: Secondary | ICD-10-CM

## 2023-01-28 NOTE — Progress Notes (Signed)
[facility] [address] [place], [place] [address]   Office Visit Note  Patient Name: Christie [name] Date of Birth [id number]  Medical Record number [id number]  Date of Service: 01/28/2023  Chief Complaint  Patient presents with   Cough    Patient c/o hoarseness, productive cough with yellow sputum, and postnasal drainage. Symptoms have been persistent since last week. Denies fever. She did have a sore throat and ear discomfort during initial onset of symptoms which has improved.      37 y/o F presents to the clinic for c/o cough, chest congestion, post nasal drainage x 4-5 days. Denies fever, chills, known exposure to covid or flu. +hoarseness of voice. Has taken otc medicine with some relief.   Cough Associated symptoms include postnasal drip. Pertinent negatives include no sore throat, shortness of breath or wheezing.      Current Medication:  Outpatient Encounter Medications as of 01/28/2023  Medication Sig   buPROPion (WELLBUTRIN XL) 150 MG 24 hr tablet TAKE 1 TABLET BY MOUTH DAILY   levonorgestrel (MIRENA, 52 MG,) 20 MCG/DAY IUD Mirena 21 mcg/24 hours (8 yrs) 52 mg intrauterine device  provided by care center   metoCLOPramide (REGLAN) 5 MG tablet Take 1 tablet (5 mg total) by mouth 4 (four) times daily -  before meals and at bedtime.   midodrine (PROAMATINE) 2.5 MG tablet Take 1 tablet (2.5 mg total) by mouth 3 (three) times daily with meals. As needed for low blood pressure with emesis/vasovagal response.   omeprazole (PRILOSEC) 40 MG capsule Take 40 mg by mouth daily.   promethazine (PHENERGAN) 50 MG tablet Take 1 tablet (50 mg total) by mouth every 6 (six) hours as needed for nausea, vomiting or refractory nausea / vomiting.   No facility-administered encounter medications on file as of 01/28/2023.      Medical History: Past Medical History:  Diagnosis Date   GERD (gastroesophageal reflux disease)    History of gestational hypertension    Pregnancy  01/17/2017   Pregnancy induced hypertension      Vital Signs: BP 110/80   Pulse 86   Temp 98.2 F (36.8 C)   Ht 5\' 4"  (1.626 m)   SpO2 99%   BMI 26.43 kg/m    Review of Systems  Constitutional: Negative.   HENT:  Positive for congestion and postnasal drip. Negative for sinus pressure, sinus pain, sore throat and trouble swallowing.   Respiratory:  Positive for cough. Negative for chest tightness, shortness of breath and wheezing.   Cardiovascular: Negative.   Neurological: Negative.     Physical Exam Constitutional:      Appearance: Normal appearance.  HENT:     Head: Atraumatic.     Right Ear: Tympanic membrane, ear canal and external ear normal.     Left Ear: Tympanic membrane, ear canal and external ear normal.     Nose: Nose normal.     Right Turbinates: Enlarged.     Mouth/Throat:     Mouth: Mucous membranes are moist.     Pharynx: Oropharynx is clear.  Eyes:     Extraocular Movements: Extraocular movements intact.  Cardiovascular:     Rate and Rhythm: Normal rate and regular rhythm.  Pulmonary:     Effort: Pulmonary effort is normal. No respiratory distress.     Breath sounds: Normal breath sounds. No wheezing, rhonchi or rales.  Musculoskeletal:     Cervical back: Neck supple.  Skin:    General: Skin is warm.  Neurological:     Mental Status: She is alert.  Psychiatric:        Mood and Affect: Mood normal.        Behavior: Behavior normal.        Thought Content: Thought content normal.        Judgment: Judgment normal.       Assessment/Plan:  1. Viral upper respiratory tract infection  2. Hoarseness of voice  Increase fluids Take otc Mucinex -DM for cough Use otc oral decongestant ie Sudafed as directed on the box Continue to watch for worsening symptoms. Pt will contact me in 2 days if her symptoms don't improve then will consider an antibiotic. Pt verbalized understanding and in agreement.   General Counseling: Christie [name]  understanding of the findings of todays visit and agrees with plan of treatment. I have discussed any further diagnostic evaluation that may be needed or ordered today. We also reviewed her medications today. she has been encouraged to call the office with any questions or concerns that should arise related to todays visit.    Time spent:20 Minutes    Gilberto Better, [place] Physician Assistant

## 2023-02-05 DIAGNOSIS — F411 Generalized anxiety disorder: Secondary | ICD-10-CM | POA: Diagnosis not present

## 2023-02-25 DIAGNOSIS — F411 Generalized anxiety disorder: Secondary | ICD-10-CM | POA: Diagnosis not present

## 2023-04-08 DIAGNOSIS — F411 Generalized anxiety disorder: Secondary | ICD-10-CM | POA: Diagnosis not present

## 2023-04-11 ENCOUNTER — Encounter: Payer: Self-pay | Admitting: Family Medicine

## 2023-04-11 ENCOUNTER — Ambulatory Visit: Payer: BC Managed Care – PPO | Admitting: Family Medicine

## 2023-04-11 VITALS — BP 116/66 | HR 82 | Ht 64.0 in | Wt 153.0 lb

## 2023-04-11 DIAGNOSIS — F419 Anxiety disorder, unspecified: Secondary | ICD-10-CM

## 2023-04-11 DIAGNOSIS — F428 Other obsessive-compulsive disorder: Secondary | ICD-10-CM

## 2023-04-11 DIAGNOSIS — F411 Generalized anxiety disorder: Secondary | ICD-10-CM

## 2023-04-11 DIAGNOSIS — R55 Syncope and collapse: Secondary | ICD-10-CM | POA: Diagnosis not present

## 2023-04-11 DIAGNOSIS — K449 Diaphragmatic hernia without obstruction or gangrene: Secondary | ICD-10-CM | POA: Diagnosis not present

## 2023-04-11 DIAGNOSIS — G8929 Other chronic pain: Secondary | ICD-10-CM

## 2023-04-11 DIAGNOSIS — R109 Unspecified abdominal pain: Secondary | ICD-10-CM | POA: Diagnosis not present

## 2023-04-11 MED ORDER — BUPROPION HCL 75 MG PO TABS: 75.0000 mg | ORAL_TABLET | Freq: Two times a day (BID) | ORAL | Status: DC

## 2023-04-11 MED ORDER — BUSPIRONE HCL 7.5 MG PO TABS
7.5000 mg | ORAL_TABLET | Freq: Two times a day (BID) | ORAL | 2 refills | Status: DC
Start: 2023-04-11 — End: 2023-05-13

## 2023-04-11 NOTE — Assessment & Plan Note (Signed)
Recommended that she follow up with GI and if no improvement and normal EGD, consider CT surgery for hiatal hernia repair

## 2023-04-11 NOTE — Assessment & Plan Note (Signed)
Chronic, recurrent vasovagal syncope triggered by gastrointestinal distress such as vomiting or diarrhea, resulting in significant falls and injuries. Prescribed Phenergan and midodrine for management during acute episodes. Discussed the importance of taking midodrine preemptively during episodes of vomiting or diarrhea to prevent hypotension and syncope. - Take Phenergan 50 mg every six hours as needed for nausea. - Take midodrine 2.5 mg three times daily with meals during episodes of vomiting or diarrhea to prevent hypotension and syncope. - Reassure no significant interactions between midodrine, Phenergan, and Wellbutrin.

## 2023-04-11 NOTE — Assessment & Plan Note (Signed)
Chronic abdominal pain likely related to hiatal hernia and possible motility issues. Symptoms include pain exacerbated by food intake, persistent sore throat, and indigestion. Previous workup includes a negative EGD in 2014 and an abdominal ultrasound last summer showing no gallstones. Currently on omeprazole 40 mg daily but continues to experience symptoms. Discussed potential benefits of an EGD and possible hiatal hernia repair if symptoms persist. Explained that surgical repair has shown good outcomes in similar cases. - Follow-up with gastroenterology for potential EGD - Consider referral to CT surgery for evaluation and potential hiatal hernia repair if symptoms persist. - Encourage dietary modifications to identify potential food triggers.

## 2023-04-11 NOTE — Assessment & Plan Note (Signed)
Currently on Wellbutrin 150 mg daily for depression. Reports initial improvement but no sustained benefit, with persistent anxiety, particularly around illness. Discussed transitioning to Buspar for anxiety management and tapering off Wellbutrin. Explained that Wellbutrin can sometimes exacerbate anxiety. - Initiate Buspar 7.5 mg twice daily for anxiety. If well-tolerated, may use as needed. - Taper Wellbutrin: reduce to 75 mg daily for two weeks, then 75 mg every other day for two weeks, then discontinue. - Follow-up in one month to assess response to medication changes.

## 2023-04-11 NOTE — Patient Instructions (Addendum)
It was a pleasure to see you today!  Thank you for choosing [facility] for your primary care.     To keep you healthy, please keep in mind the following health maintenance items that you are due for:   You are due for pap smear for cervical cancer screening  Best Wishes,   Dr. Roxan Hockey     VISIT SUMMARY:  Today, we discussed your ongoing issues with chronic abdominal pain, vasovagal syncope, and depression. We reviewed your current medications and symptoms, and we have made some adjustments to better manage your conditions. We also talked about the importance of follow-up appointments and potential further evaluations.  YOUR PLAN:  -CHRONIC ABDOMINAL PAIN: Chronic abdominal pain can be caused by various factors, including your hiatal hernia and possible motility issues. We discussed the potential benefits of an EGD (a procedure to look inside your esophagus and stomach) and possible surgical repair of the hiatal hernia if your symptoms persist. You should follow up with gastroenterology for further evaluation and consider dietary changes to identify any food triggers.   -DEPRESSION: Depression is a mood disorder that causes persistent feelings of sadness and loss of interest. You are currently on Wellbutrin, but we will transition you to Buspar for anxiety management. You should start Buspar 7.5 mg twice daily and taper off Wellbutrin by reducing the dose gradually over the next month. We will follow up in one month to see how you are responding to the new medication.  -GENERAL HEALTH MAINTENANCE: Continue with your current medications, including omeprazole 40 mg daily and Mirena for contraception. Routine health maintenance is important to manage your overall well-being.  INSTRUCTIONS:  Please schedule a follow-up appointment in one month to evaluate your response to the new anxiety medication and overall symptom management. Additionally, contact gastroenterology to  schedule an EGD for further evaluation of your abdominal pain.

## 2023-04-11 NOTE — Progress Notes (Signed)
Established patient visit   Patient: Christie [name]   DOB: [birth year]   37 y.o. Female  MRN: [id number] Visit Date: 04/11/2023  Today's healthcare provider: Ronnald Ramp, MD   Chief Complaint  Patient presents with   Medical Management of Chronic Issues    Last seen 11/30/22 and here to reestablish with new PCP in office   Medication Management    Patient reports having question in regards to some of her medications   Subjective     HPI     Medical Management of Chronic Issues    Additional comments: Last seen 11/30/22 and here to reestablish with new PCP in office        Medication Management    Additional comments: Patient reports having question in regards to some of her medications      Last edited by Christie [name], CMA on 04/11/2023  9:00 AM.       Discussed the use of AI scribe software for clinical note transcription with the patient, who gave verbal consent to proceed.  History of Present Illness   The patient is a 38 year old female with chronic abdominal pain and hiatal hernia who presents for chronic care and medication management.  She has chronic abdominal pain closely related to food intake, with significant pain after eating. A hiatal hernia was diagnosed in 2012, which became problematic in the past year. She suspects an ulcer last year due to severe pain with eating. Omeprazole 40 mg daily has helped manage the pain but not completely alleviate it. She experiences daily sore throats, indicating ongoing acid reflux. She was prescribed Reglan but has not taken it due to concerns about medication interactions and side effects. An abdominal ultrasound last summer showed no gallstones, and a negative EGD was performed in 2014. She has not had a follow-up with gastroenterology since May of last year.  She experiences nausea that fluctuates, often related to food intake. Over the holidays, she noted increased nausea affecting her appetite,  sometimes feeling she could skip meals. She has Phenergan 50 mg every six hours as needed for nausea but has not used it recently.  She has a history of vasovagal syncope related to gastrointestinal issues, having experienced syncopal episodes during stomach illnesses. She was prescribed midodrine 2.5 mg three times daily as needed for hypotension but has not used it. She describes a rapid onset of symptoms during her last episode, leading to a fall and head injury.  She is on Wellbutrin 150 mg daily for depression but reports that her anxiety has not improved since starting the medication. She initially felt better but has since returned to her baseline anxiety levels, particularly during the winter months.       Last CPE 2023  Past Medical History:  Diagnosis Date   GERD (gastroesophageal reflux disease)    History of gestational hypertension    Pregnancy 01/17/2017   Pregnancy induced hypertension    Vaginal delivery 04/11/2023   2018, IOL, gest HTN, M, 6#, [name]      Medications: Outpatient Medications Prior to Visit  Medication Sig   levonorgestrel (MIRENA, 52 MG,) 20 MCG/DAY IUD Mirena 21 mcg/24 hours (8 yrs) 52 mg intrauterine device  provided by care center   midodrine (PROAMATINE) 2.5 MG tablet Take 1 tablet (2.5 mg total) by mouth 3 (three) times daily with meals. As needed for low blood pressure with emesis/vasovagal response.   omeprazole (PRILOSEC) 40 MG capsule Take 40 mg by  mouth daily.   promethazine (PHENERGAN) 50 MG tablet Take 1 tablet (50 mg total) by mouth every 6 (six) hours as needed for nausea, vomiting or refractory nausea / vomiting.   [DISCONTINUED] buPROPion (WELLBUTRIN XL) 150 MG 24 hr tablet TAKE 1 TABLET BY MOUTH DAILY   [DISCONTINUED] metoCLOPramide (REGLAN) 5 MG tablet Take 1 tablet (5 mg total) by mouth 4 (four) times daily -  before meals and at bedtime. (Patient not taking: Reported on 04/11/2023)   No facility-administered medications prior to  visit.    Review of Systems  Last CBC Lab Results  Component Value Date   WBC 5.5 07/19/2022   HGB 13.9 07/19/2022   HCT 43.7 07/19/2022   MCV 89 07/19/2022   MCH 28.3 07/19/2022   RDW 12.4 07/19/2022   PLT 248 07/19/2022   Last metabolic panel Lab Results  Component Value Date   GLUCOSE 100 (H) 07/19/2022   NA 141 07/19/2022   K 4.0 07/19/2022   CL 105 07/19/2022   CO2 20 07/19/2022   BUN 11 07/19/2022   CREATININE 0.99 07/19/2022   EGFR 76 07/19/2022   CALCIUM 9.5 07/19/2022   PROT 7.8 07/19/2022   ALBUMIN 4.8 07/19/2022   LABGLOB 3.0 07/19/2022   AGRATIO 1.6 07/19/2022   BILITOT 0.4 07/19/2022   ALKPHOS 83 07/19/2022   AST 12 07/19/2022   ALT 9 07/19/2022   ANIONGAP 10 01/04/2020   Last lipids Lab Results  Component Value Date   CHOL 181 06/27/2021   HDL 36 (L) 06/27/2021   LDLCALC 120 (H) 06/27/2021   TRIG 138 06/27/2021   CHOLHDL 5.0 (H) 06/27/2021   Last hemoglobin A1c Lab Results  Component Value Date   HGBA1C 5.6 11/30/2022   Last thyroid functions Lab Results  Component Value Date   TSH 2.020 07/19/2022        Objective    BP 116/66 (BP Location: Left Arm, Patient Position: Sitting, Cuff Size: Normal)   Pulse 82   Ht 5\' 4"  (1.626 m)   Wt 153 lb (69.4 kg)   BMI 26.26 kg/m  BP Readings from Last 3 Encounters:  04/11/23 116/66  01/28/23 110/80  11/30/22 128/77   Wt Readings from Last 3 Encounters:  04/11/23 153 lb (69.4 kg)  11/30/22 154 lb (69.9 kg)  04/24/22 175 lb 6.4 oz (79.6 kg)       Physical Exam  Physical Exam   VITALS: BP- 116/66, P- 82 MEASUREMENTS: WT- 153 pounds ABDOMEN: Tenderness in the right of center and epigastric region       No results found for any visits on 04/11/23.  Assessment & Plan     Problem List Items Addressed This Visit       Respiratory   Hernia, hiatal   Recommended that she follow up with GI and if no improvement and normal EGD, consider CT surgery for hiatal hernia repair          Other   Vasovagal syncopes   Chronic, recurrent vasovagal syncope triggered by gastrointestinal distress such as vomiting or diarrhea, resulting in significant falls and injuries. Prescribed Phenergan and midodrine for management during acute episodes. Discussed the importance of taking midodrine preemptively during episodes of vomiting or diarrhea to prevent hypotension and syncope. - Take Phenergan 50 mg every six hours as needed for nausea. - Take midodrine 2.5 mg three times daily with meals during episodes of vomiting or diarrhea to prevent hypotension and syncope. - Reassure no significant interactions between midodrine, Phenergan, and  Wellbutrin.      Obsessive thinking   Currently on Wellbutrin 150 mg daily for depression. Reports initial improvement but no sustained benefit, with persistent anxiety, particularly around illness. Discussed transitioning to Buspar for anxiety management and tapering off Wellbutrin. Explained that Wellbutrin can sometimes exacerbate anxiety. - Initiate Buspar 7.5 mg twice daily for anxiety. If well-tolerated, may use as needed. - Taper Wellbutrin: reduce to 75 mg daily for two weeks, then 75 mg every other day for two weeks, then discontinue. - Follow-up in one month to assess response to medication changes.      Chronic abdominal pain - Primary   Chronic abdominal pain likely related to hiatal hernia and possible motility issues. Symptoms include pain exacerbated by food intake, persistent sore throat, and indigestion. Previous workup includes a negative EGD in 2014 and an abdominal ultrasound last summer showing no gallstones. Currently on omeprazole 40 mg daily but continues to experience symptoms. Discussed potential benefits of an EGD and possible hiatal hernia repair if symptoms persist. Explained that surgical repair has shown good outcomes in similar cases. - Follow-up with gastroenterology for potential EGD - Consider referral to CT surgery for  evaluation and potential hiatal hernia repair if symptoms persist. - Encourage dietary modifications to identify potential food triggers.      Relevant Medications   buPROPion (WELLBUTRIN) 75 MG tablet   Other Visit Diagnoses       Anxiety       Relevant Medications   busPIRone (BUSPAR) 7.5 MG tablet   buPROPion (WELLBUTRIN) 75 MG tablet     GAD (generalized anxiety disorder)       Relevant Medications   busPIRone (BUSPAR) 7.5 MG tablet   buPROPion (WELLBUTRIN) 75 MG tablet           Return in about 1 month (around 05/10/2023) for Anxiety.         Ronnald Ramp, MD  [facility] [phone] (phone) [phone] (fax)  [facility]

## 2023-05-09 DIAGNOSIS — F411 Generalized anxiety disorder: Secondary | ICD-10-CM | POA: Diagnosis not present

## 2023-05-13 ENCOUNTER — Ambulatory Visit: Payer: Self-pay | Admitting: Family Medicine

## 2023-05-13 ENCOUNTER — Encounter: Payer: Self-pay | Admitting: Family Medicine

## 2023-05-13 VITALS — BP 113/74 | HR 86 | Ht 64.0 in | Wt 152.0 lb

## 2023-05-13 DIAGNOSIS — R55 Syncope and collapse: Secondary | ICD-10-CM | POA: Diagnosis not present

## 2023-05-13 DIAGNOSIS — G8929 Other chronic pain: Secondary | ICD-10-CM

## 2023-05-13 DIAGNOSIS — F419 Anxiety disorder, unspecified: Secondary | ICD-10-CM | POA: Diagnosis not present

## 2023-05-13 DIAGNOSIS — R109 Unspecified abdominal pain: Secondary | ICD-10-CM

## 2023-05-13 DIAGNOSIS — F428 Other obsessive-compulsive disorder: Secondary | ICD-10-CM

## 2023-05-13 MED ORDER — BUSPIRONE HCL 10 MG PO TABS
10.0000 mg | ORAL_TABLET | Freq: Two times a day (BID) | ORAL | 2 refills | Status: DC
Start: 2023-05-13 — End: 2023-07-12

## 2023-05-13 NOTE — Assessment & Plan Note (Signed)
 Chronic anxiety with improved symptoms on Buspar 7.5 mg twice daily.  Current anxiety is manageable but still present, particularly fixated on stomach bugs and related vasovagal episodes. Discussed increasing Buspar to 10 mg twice daily to further manage anxiety. Patient agreed to this adjustment. - DC wellbutrin  - Increase Buspar to 10 mg twice daily - Follow up in 6 weeks virtually to assess response to increased Buspar dosage

## 2023-05-13 NOTE — Progress Notes (Signed)
 Established patient visit   Patient: Christie [name]   DOB: 1985-08-30   37 y.o. Female  MRN: [id number] Visit Date: 05/13/2023  Today's healthcare provider: Ronnald Ramp, MD   Chief Complaint  Patient presents with   Anxiety    Follow up, she feel the medication is helping   Subjective     HPI     Anxiety    Additional comments: Follow up, she feel the medication is helping      Last edited by Thedora Hinders, CMA on 05/13/2023  3:49 PM.       Discussed the use of AI scribe software for clinical note transcription with the patient, who gave verbal consent to proceed.  History of Present Illness   Christie [name] "Christie [name]" is a 38 year old female who presents for chronic follow-up.  She is here for a follow-up regarding her anxiety. She was previously on Wellbutrin 75 mg twice daily, which she has recently discontinued. She experienced transient symptoms such as hot flashes and flushing for about three days after stopping Wellbutrin, but these have resolved. She feels 'pretty good' and notes that her anxiety has improved with the current medication regimen, although she still experiences anxiety related to specific stressors, such as the stomach bug going around. She is currently taking Buspar 7.5 mg twice daily for anxiety and obsessive thinking. She feels that the medication is helping to take the edge off her anxiety, allowing her to be more functional and less fixated on her worries.  For her vasovagal episodes, she is on midodrine 2.5 mg three times daily with meals, especially during episodes of vomiting or diarrhea to prevent hypotension and syncope. She also uses Phenergan 50 mg every six hours as needed for nausea. She has a history of passing out, including an incident where she fell off the toilet and hit her head, which she recalls as an anniversary event. She has been preparing for potential episodes by having medications and a soft helmet ready.  She has a family history of similar episodes, as her mother also experiences vasovagal syncope, particularly with gastrointestinal illnesses. Her mother recently had an episode resulting in a fall and injury, which has heightened her anxiety about her own condition.  She is following up with gastroenterology for chronic abdominal pain and has an upcoming appointment with a gastroenterologist. She has previously seen a neurologist who discussed the vasovagal syncope but did not conduct further testing. She has not seen a cardiologist for this issue.  She has two small children and is actively managing her household responsibilities.         Past Medical History:  Diagnosis Date   GERD (gastroesophageal reflux disease)    History of gestational hypertension    Pregnancy 01/17/2017   Pregnancy induced hypertension    Vaginal delivery 04/11/2023   2018, IOL, gest HTN, M, 6#, [name]      Medications: Outpatient Medications Prior to Visit  Medication Sig   levonorgestrel (MIRENA, 52 MG,) 20 MCG/DAY IUD Mirena 21 mcg/24 hours (8 yrs) 52 mg intrauterine device  provided by care center   midodrine (PROAMATINE) 2.5 MG tablet Take 1 tablet (2.5 mg total) by mouth 3 (three) times daily with meals. As needed for low blood pressure with emesis/vasovagal response.   omeprazole (PRILOSEC) 40 MG capsule Take 40 mg by mouth daily.   promethazine (PHENERGAN) 50 MG tablet Take 1 tablet (50 mg total) by mouth every 6 (six) hours  as needed for nausea, vomiting or refractory nausea / vomiting.   [DISCONTINUED] busPIRone (BUSPAR) 7.5 MG tablet Take 1 tablet (7.5 mg total) by mouth 2 (two) times daily.   [DISCONTINUED] buPROPion (WELLBUTRIN) 75 MG tablet Take 1 tablet (75 mg total) by mouth 2 (two) times daily. (Patient not taking: Reported on 05/13/2023)   No facility-administered medications prior to visit.    Review of Systems  Last CBC Lab Results  Component Value Date   WBC 5.5 07/19/2022   HGB  13.9 07/19/2022   HCT 43.7 07/19/2022   MCV 89 07/19/2022   MCH 28.3 07/19/2022   RDW 12.4 07/19/2022   PLT 248 07/19/2022   Last metabolic panel Lab Results  Component Value Date   GLUCOSE 100 (H) 07/19/2022   NA 141 07/19/2022   K 4.0 07/19/2022   CL 105 07/19/2022   CO2 20 07/19/2022   BUN 11 07/19/2022   CREATININE 0.99 07/19/2022   EGFR 76 07/19/2022   CALCIUM 9.5 07/19/2022   PROT 7.8 07/19/2022   ALBUMIN 4.8 07/19/2022   LABGLOB 3.0 07/19/2022   AGRATIO 1.6 07/19/2022   BILITOT 0.4 07/19/2022   ALKPHOS 83 07/19/2022   AST 12 07/19/2022   ALT 9 07/19/2022   ANIONGAP 10 01/04/2020   Last lipids Lab Results  Component Value Date   CHOL 181 06/27/2021   HDL 36 (L) 06/27/2021   LDLCALC 120 (H) 06/27/2021   TRIG 138 06/27/2021   CHOLHDL 5.0 (H) 06/27/2021   Last hemoglobin A1c Lab Results  Component Value Date   HGBA1C 5.6 11/30/2022   Last thyroid functions Lab Results  Component Value Date   TSH 2.020 07/19/2022        Objective    BP 113/74   Pulse 86   Ht 5\' 4"  (1.626 m)   Wt 152 lb (68.9 kg)   SpO2 100%   BMI 26.09 kg/m  BP Readings from Last 3 Encounters:  05/13/23 113/74  04/11/23 116/66  01/28/23 110/80   Wt Readings from Last 3 Encounters:  05/13/23 152 lb (68.9 kg)  04/11/23 153 lb (69.4 kg)  11/30/22 154 lb (69.9 kg)        Physical Exam Vitals reviewed.  Constitutional:      General: She is not in acute distress.    Appearance: Normal appearance. She is not ill-appearing, toxic-appearing or diaphoretic.  Eyes:     Conjunctiva/sclera: Conjunctivae normal.  Cardiovascular:     Rate and Rhythm: Normal rate and regular rhythm.     Pulses: Normal pulses.     Heart sounds: Normal heart sounds. No murmur heard.    No friction rub. No gallop.  Pulmonary:     Effort: Pulmonary effort is normal. No respiratory distress.     Breath sounds: Normal breath sounds. No stridor. No wheezing, rhonchi or rales.  Abdominal:      General: Bowel sounds are normal. There is no distension.     Palpations: Abdomen is soft.     Tenderness: There is no abdominal tenderness.  Musculoskeletal:     Right lower leg: No edema.     Left lower leg: No edema.  Skin:    Findings: No erythema or rash.  Neurological:     Mental Status: She is alert and oriented to person, place, and time.  Psychiatric:        Mood and Affect: Mood and affect normal.        Speech: Speech normal.  Behavior: Behavior normal. Behavior is cooperative.     Comments: Describes mood of "on edge"       No results found for any visits on 05/13/23.  Assessment & Plan     Problem List Items Addressed This Visit       Other   Vasovagal syncopes   Vasovagal episodes associated with gastrointestinal symptoms such as vomiting and diarrhea. Managed with midodrine 2.5 mg three times daily with meals to prevent hypotension and syncope. Significant anxiety related to potential future episodes. Discussed unpredictability of syncope and importance of preparedness. Uses Phenergan 50 mg every six hours as needed for nausea. - Continue midodrine 2.5 mg three times daily with meals - Continue Phenergan 50 mg every six hours as needed for nausea      Obsessive thinking   Relevant Medications   busPIRone (BUSPAR) 10 MG tablet   Anxiety - Primary   Chronic anxiety with improved symptoms on Buspar 7.5 mg twice daily.  Current anxiety is manageable but still present, particularly fixated on stomach bugs and related vasovagal episodes. Discussed increasing Buspar to 10 mg twice daily to further manage anxiety. Patient agreed to this adjustment. - DC wellbutrin  - Increase Buspar to 10 mg twice daily - Follow up in 6 weeks virtually to assess response to increased Buspar dosage      Relevant Medications   busPIRone (BUSPAR) 10 MG tablet        General Health Maintenance Due for a physical examination. - Schedule physical examination in July or  August     Return in about 6 weeks (around 06/24/2023) for  Anxiety.         Ronnald Ramp, MD  [facility] [phone] (phone) [phone] (fax)  [facility]

## 2023-05-13 NOTE — Assessment & Plan Note (Signed)
 Ongoing abdominal pain under evaluation by gastroenterology. Scheduled to see a gastroenterologist on Friday and discussed the possibility of an endoscopy. Patient is prepared for potential outcomes and understands the need for further diagnostic evaluation. - Follow up with gastroenterologist on Friday - Consider endoscopy based on gastroenterologist's recommendation

## 2023-05-13 NOTE — Assessment & Plan Note (Signed)
 Vasovagal episodes associated with gastrointestinal symptoms such as vomiting and diarrhea. Managed with midodrine 2.5 mg three times daily with meals to prevent hypotension and syncope. Significant anxiety related to potential future episodes. Discussed unpredictability of syncope and importance of preparedness. Uses Phenergan 50 mg every six hours as needed for nausea. - Continue midodrine 2.5 mg three times daily with meals - Continue Phenergan 50 mg every six hours as needed for nausea

## 2023-05-17 DIAGNOSIS — K219 Gastro-esophageal reflux disease without esophagitis: Secondary | ICD-10-CM | POA: Diagnosis not present

## 2023-05-17 DIAGNOSIS — R1013 Epigastric pain: Secondary | ICD-10-CM | POA: Diagnosis not present

## 2023-06-04 DIAGNOSIS — F411 Generalized anxiety disorder: Secondary | ICD-10-CM | POA: Diagnosis not present

## 2023-06-19 ENCOUNTER — Ambulatory Visit (INDEPENDENT_AMBULATORY_CARE_PROVIDER_SITE_OTHER): Payer: Self-pay

## 2023-06-19 DIAGNOSIS — K2950 Unspecified chronic gastritis without bleeding: Secondary | ICD-10-CM | POA: Diagnosis not present

## 2023-06-19 DIAGNOSIS — K2289 Other specified disease of esophagus: Secondary | ICD-10-CM | POA: Diagnosis not present

## 2023-07-09 ENCOUNTER — Other Ambulatory Visit: Payer: Self-pay | Admitting: Family Medicine

## 2023-07-09 DIAGNOSIS — F428 Other obsessive-compulsive disorder: Secondary | ICD-10-CM

## 2023-07-12 NOTE — Telephone Encounter (Signed)
 Requested Prescriptions  Pending Prescriptions Disp Refills   busPIRone  (BUSPAR ) 10 MG tablet [Pharmacy Med Name: BUSPIRONE  HCL 10 MG TAB] 60 tablet 2    Sig: TAKE ONE (1) TABLET BY MOUTH TWO TIMES PER DAY     Psychiatry: Anxiolytics/Hypnotics - Non-controlled Passed - 07/12/2023  8:45 AM      Passed - Valid encounter within last 12 months    Recent Outpatient Visits           2 months ago Anxiety   [facility] Mimi Alt, MD

## 2023-07-16 DIAGNOSIS — D225 Melanocytic nevi of trunk: Secondary | ICD-10-CM | POA: Diagnosis not present

## 2023-07-16 DIAGNOSIS — D2261 Melanocytic nevi of right upper limb, including shoulder: Secondary | ICD-10-CM | POA: Diagnosis not present

## 2023-07-16 DIAGNOSIS — D2262 Melanocytic nevi of left upper limb, including shoulder: Secondary | ICD-10-CM | POA: Diagnosis not present

## 2023-07-16 DIAGNOSIS — D2272 Melanocytic nevi of left lower limb, including hip: Secondary | ICD-10-CM | POA: Diagnosis not present

## 2023-07-30 DIAGNOSIS — F411 Generalized anxiety disorder: Secondary | ICD-10-CM | POA: Diagnosis not present

## 2023-09-24 DIAGNOSIS — F411 Generalized anxiety disorder: Secondary | ICD-10-CM | POA: Diagnosis not present

## 2023-10-25 ENCOUNTER — Other Ambulatory Visit: Payer: Self-pay | Admitting: Family Medicine

## 2023-10-25 DIAGNOSIS — F428 Other obsessive-compulsive disorder: Secondary | ICD-10-CM

## 2023-11-05 DIAGNOSIS — F411 Generalized anxiety disorder: Secondary | ICD-10-CM | POA: Diagnosis not present

## 2023-11-30 ENCOUNTER — Encounter: Payer: Self-pay | Admitting: Family Medicine

## 2023-11-30 ENCOUNTER — Telehealth: Admitting: Nurse Practitioner

## 2023-11-30 DIAGNOSIS — R399 Unspecified symptoms and signs involving the genitourinary system: Secondary | ICD-10-CM | POA: Diagnosis not present

## 2023-11-30 MED ORDER — NITROFURANTOIN MONOHYD MACRO 100 MG PO CAPS
100.0000 mg | ORAL_CAPSULE | Freq: Two times a day (BID) | ORAL | 0 refills | Status: DC
Start: 2023-11-30 — End: 2023-12-05

## 2023-11-30 NOTE — Progress Notes (Signed)
 I have spent 5 minutes in review of e-visit questionnaire, review and updating patient chart, medical decision making and response to patient.   Claiborne Rigg, NP

## 2023-11-30 NOTE — Progress Notes (Signed)

## 2023-12-01 ENCOUNTER — Other Ambulatory Visit: Payer: Self-pay | Admitting: Nurse Practitioner

## 2023-12-01 DIAGNOSIS — R399 Unspecified symptoms and signs involving the genitourinary system: Secondary | ICD-10-CM

## 2023-12-01 MED ORDER — NITROFURANTOIN MONOHYD MACRO 100 MG PO CAPS: 100.0000 mg | ORAL_CAPSULE | Freq: Two times a day (BID) | ORAL | 0 refills | Status: AC

## 2023-12-11 ENCOUNTER — Other Ambulatory Visit: Payer: Self-pay

## 2023-12-11 ENCOUNTER — Other Ambulatory Visit: Payer: Self-pay | Admitting: Adult Health

## 2023-12-11 ENCOUNTER — Encounter: Payer: Self-pay | Admitting: Adult Health

## 2023-12-11 ENCOUNTER — Ambulatory Visit: Payer: Self-pay | Admitting: Adult Health

## 2023-12-11 VITALS — BP 120/80 | HR 116 | Temp 98.5°F | Ht 64.0 in | Wt 155.0 lb

## 2023-12-11 DIAGNOSIS — R350 Frequency of micturition: Secondary | ICD-10-CM

## 2023-12-11 DIAGNOSIS — R1030 Lower abdominal pain, unspecified: Secondary | ICD-10-CM

## 2023-12-11 LAB — POCT URINALYSIS DIPSTICK (MANUAL)
Nitrite, UA: NEGATIVE
Poct Bilirubin: NEGATIVE
Poct Glucose: NORMAL mg/dL
Poct Ketones: NEGATIVE
Poct Protein: NEGATIVE mg/dL
Poct Urobilinogen: NORMAL mg/dL
Spec Grav, UA: <=1.005 — AB (ref 1.010–1.025)
pH, UA: 6 (ref 5.0–8.0)

## 2023-12-11 MED ORDER — AMOXICILLIN-POT CLAVULANATE 875-125 MG PO TABS: 1.0000 | ORAL_TABLET | Freq: Two times a day (BID) | ORAL | 0 refills | Status: AC

## 2023-12-11 NOTE — Progress Notes (Signed)
 [facility] [address] [place], [place] [address]   Office Visit Note  Patient Name: Christie [name] Date of Birth [id number]  Medical Record number [id number]  Date of Service: 12/11/2023  Chief Complaint  Patient presents with   Acute Visit    Patient states she has a burning sensation when urinating, urinary frequency, and lower abdominal pain. Symptoms began about 10 days ago. She had an e-visit and was prescribed a 5 day course of abx twice daily but is still having the same symptoms. She also reports having constipation issues since starting nortriptyline.    HPI 38 year old presents with recurrent urinary symptoms. She reports being evaluated via telehealth 10 days ago for urinary frequency, burning, and suprapubic pain, for which she was prescribed a 5-day course of nitrofurantoin  (Macrobid ). She completed the antibiotic course as directed without missed doses. Symptoms improved initially but returned 2-3 days ago, including urgency, frequency, dysuria, and suprapubic pain. She describes several near-miss episodes of incontinence. She denies fever, chills, flank pain, hematuria, malodorous urine, nocturia, or concern for sexually transmitted infections. She drinks approximately 40 oz of water daily and 1-2 cups of tea.  Additionally, she reports chronic constipation ongoing for several months. Her last bowel movement was on Sunday, described as small, hard pellets. She notes occasional blood with stool, which she attributes to a known history of hemorrhoids. She denies nausea, vomiting, or flatus. She has not attempted any therapies for constipation.  Current Medication:  Outpatient Encounter Medications as of 12/11/2023  Medication Sig   amoxicillin -clavulanate (AUGMENTIN) 875-125 MG tablet Take 1 tablet by mouth 2 (two) times daily.   busPIRone  (BUSPAR ) 10 MG tablet TAKE ONE (1) TABLET BY MOUTH TWO TIMES PER DAY   famotidine (PEPCID) 40 MG tablet Take 1 tablet by mouth at  bedtime.   levonorgestrel  (MIRENA , 52 MG,) 20 MCG/DAY IUD Mirena  21 mcg/24 hours (8 yrs) 52 mg intrauterine device  provided by care center   nortriptyline (PAMELOR) 25 MG capsule Take 25 mg by mouth at bedtime.   promethazine  (PHENERGAN ) 50 MG tablet Take 1 tablet (50 mg total) by mouth every 6 (six) hours as needed for nausea, vomiting or refractory nausea / vomiting.   midodrine  (PROAMATINE ) 2.5 MG tablet Take 1 tablet (2.5 mg total) by mouth 3 (three) times daily with meals. As needed for low blood pressure with emesis/vasovagal response. (Patient not taking: Reported on 12/11/2023)   No facility-administered encounter medications on file as of 12/11/2023.   Medical History: Past Medical History:  Diagnosis Date   GERD (gastroesophageal reflux disease)    History of gestational hypertension    Pregnancy 01/17/2017   Pregnancy induced hypertension    Vaginal delivery 04/11/2023   2018, IOL, gest HTN, M, 6#, [place]     Vital Signs: BP 120/80   Pulse (!) 116   Temp 98.5 F (36.9 C)   Ht 5' 4 (1.626 m)   Wt 70.3 kg   SpO2 98%   BMI 26.61 kg/m    Review of Systems  Constitutional:  Negative for chills, diaphoresis and fever.  Respiratory: Negative.    Cardiovascular: Negative.   Gastrointestinal:  Positive for abdominal distention, blood in stool and constipation. Negative for abdominal pain, rectal pain and vomiting.  Genitourinary:  Positive for frequency and urgency. Negative for decreased urine volume, flank pain, hematuria and vaginal bleeding.    Physical Exam Vitals reviewed.  Constitutional:      General: She is not in acute distress.  Appearance: She is not ill-appearing.  Cardiovascular:     Rate and Rhythm: Tachycardia present.     Heart sounds: Normal heart sounds.     Comments: Walked over Pulmonary:     Effort: Pulmonary effort is normal.     Breath sounds: Normal breath sounds.  Abdominal:     General: There is no distension.     Palpations:  Abdomen is soft. There is no mass.     Tenderness: There is abdominal tenderness. There is no right CVA tenderness, left CVA tenderness, guarding or rebound.     Comments: Hypoactive bowel sounds x 4 quadrants  Neurological:     Mental Status: She is alert.    Assessment/Plan: 1. Urinary frequency (Primary) - POCT Urinalysis Dip Manual +Leuks, trace blood; urine culture sent-results pending Amoxicillin -clavulanate (Augmentin) 875 mg/125 mg tablet Sig: Take 1 tablet PO every 12 hours  7 days, continue until culture/susceptibility results are available. Disp: 14 tablets Refills: 0  Take with food to minimize GI upset. Complete full course unless directed otherwise based on culture. Void after intercourse, maintain front-to-back wiping, and avoid scented hygiene products. UTI: Return if fever, chills, flank pain, gross hematuria, or worsening urinary symptoms develop. Will notify if urine culture results warrant adjust to antibiotics.  2. Lower abdominal pain Warm prune juice with Miralax mixed in Sprite (daily as tolerated). Alternative:  bottle magnesium  citrate once, if no relief from above. Increase hydration (>=64 oz water daily). Encourage dietary fiber (fruits, vegetables, whole grains). Avoid prolonged straining. Monitor for rectal bleeding beyond hemorrhoids or severe abdominal pain. Constipation: Return if abdominal pain, vomiting, obstipation, or rectal bleeding increases.  General Counseling: tarini carrier understanding of the findings of todays visit and agrees with plan of treatment. I have discussed any further diagnostic evaluation that may be needed or ordered today. We also reviewed her medications today. she has been encouraged to call the office with any questions or concerns that should arise related to todays visit.   Orders Placed This Encounter  Procedures   POCT Urinalysis Dip Manual    Meds ordered this encounter  Medications    amoxicillin -clavulanate (AUGMENTIN) 875-125 MG tablet    Sig: Take 1 tablet by mouth 2 (two) times daily.    Dispense:  14 tablet    Refill:  0    Time spent: 15 Minutes  Note written by Lamar L. Delores, DNP/AGNP-S  Juliene DOROTHA Howells AGNP-C Nurse Practitioner

## 2023-12-13 ENCOUNTER — Ambulatory Visit: Payer: Self-pay | Admitting: Adult Health

## 2023-12-13 LAB — URINE CULTURE: Organism ID, Bacteria: NO GROWTH

## 2024-01-24 ENCOUNTER — Other Ambulatory Visit: Payer: Self-pay | Admitting: Family Medicine

## 2024-01-24 DIAGNOSIS — F428 Other obsessive-compulsive disorder: Secondary | ICD-10-CM

## 2024-01-27 NOTE — Telephone Encounter (Signed)
 Requested Prescriptions  Pending Prescriptions Disp Refills   busPIRone  (BUSPAR ) 10 MG tablet [Pharmacy Med Name: BUSPIRONE  HCL 10 MG TAB] 180 tablet 0    Sig: TAKE ONE (1) TABLET BY MOUTH TWO TIMES PER DAY     Psychiatry: Anxiolytics/Hypnotics - Non-controlled Passed - 01/27/2024  3:55 PM      Passed - Valid encounter within last 12 months    Recent Outpatient Visits           8 months ago Anxiety   [place] [facility] Buhler, [place], MD

## 2024-02-19 DIAGNOSIS — F411 Generalized anxiety disorder: Secondary | ICD-10-CM | POA: Diagnosis not present
